# Patient Record
Sex: Female | Born: 1960 | Hispanic: Yes | Marital: Married | Smoking: Never smoker
Health system: Southern US, Community
[De-identification: ages and names within clinical notes are randomized; demographics above are authoritative.]

---

## 2017-10-23 ENCOUNTER — Encounter (HOSPITAL_COMMUNITY): Payer: Self-pay | Admitting: Emergency Medicine

## 2017-10-23 ENCOUNTER — Ambulatory Visit (HOSPITAL_COMMUNITY)
Admission: EM | Admit: 2017-10-23 | Discharge: 2017-10-23 | Disposition: A | Discharge status: Home / Self Care | Payer: Self-pay | Attending: Family Medicine | Admitting: Family Medicine

## 2017-10-23 DIAGNOSIS — S39012A Strain of muscle, fascia and tendon of lower back, initial encounter: Secondary | ICD-10-CM

## 2017-10-23 DIAGNOSIS — T148XXA Other injury of unspecified body region, initial encounter: Secondary | ICD-10-CM

## 2017-10-23 DIAGNOSIS — M545 Low back pain: Secondary | ICD-10-CM

## 2017-10-23 MED ORDER — NAPROXEN 500 MG PO TABS: 500.0000 mg | ORAL_TABLET | Freq: Two times a day (BID) | ORAL | 0 refills | Status: AC

## 2017-10-23 NOTE — Discharge Instructions (Signed)
Your exam was most consistent with muscle strain. Start Naproxen as directed. Ice/heat compresses as needed. This can take up to 3-4 weeks to completely resolve, but you should be feeling better each week. Follow up here or with PCP if symptoms worsen, changes for reevaluation. If experience numbness/tingling of the inner thighs, loss of bladder or bowel control, go to the emergency department for evaluation.

## 2017-10-23 NOTE — ED Provider Notes (Signed)
[facility]    CSN: [id number] Arrival date & time: 10/23/17  1224     History   Chief Complaint Chief Complaint  Patient presents with  . Back Pain    HPI Cynthia Lamb is a 56 y.o. female.   56 year old female comes in for 2 day history of back pain. States she woke up with the pain, which is constant, diffuse across the whole back. Describes the pain as spasms, worse with movement. Has not tried anything for pain, but has not noticed any alleviating factor. Denies injury/trauma. Denies urinary symptoms such as frequency, dysuria, hematuria. Denies fever, chills, night sweats.       History reviewed. No pertinent past medical history.  There are no active problems to display for this patient.   History reviewed. No pertinent surgical history.  OB History    No data available       Home Medications    Prior to Admission medications   Medication Sig Start Date End Date Taking? Authorizing Provider  naproxen (NAPROSYN) 500 MG tablet Take 1 tablet (500 mg total) 2 (two) times daily for 10 days by mouth. 10/23/17 11/02/17  Belinda FisherYu, Cynthia Resende V, PA-C    Family History History reviewed. No pertinent family history.  Social History Social History   Tobacco Use  . Smoking status: Never Smoker  . Smokeless tobacco: Never Used  Substance Use Topics  . Alcohol use: Not on file  . Drug use: Not on file     Allergies   Patient has no known allergies.   Review of Systems Review of Systems  Reason unable to perform ROS: See HPI as above.     Physical Exam Triage Vital Signs ED Triage Vitals  Enc Vitals Group     BP 10/23/17 1249 115/78     Pulse Rate 10/23/17 1249 70     Resp 10/23/17 1249 20     Temp 10/23/17 1249 98.9 F (37.2 C)     Temp Source 10/23/17 1249 Oral     SpO2 10/23/17 1249 99 %     Weight --      Height --      Head Circumference --      Peak Flow --      Pain Score 10/23/17 1250 8     Pain Loc --      Pain Edu? --    Excl. in GC? --    No data found.  Updated Vital Signs BP 115/78 (BP Location: Left Arm)   Pulse 70   Temp 98.9 F (37.2 C) (Oral)   Resp 20   SpO2 99%   Physical Exam  Constitutional: She is oriented to person, place, and time. She appears well-developed and well-nourished. No distress.  HENT:  Head: Normocephalic and atraumatic.  Eyes: Conjunctivae are normal. Pupils are equal, round, and reactive to light.  Neck: Normal range of motion. Neck supple. No spinous process tenderness and no muscular tenderness present. Normal range of motion present.  Cardiovascular: Normal rate, regular rhythm and normal heart sounds. Exam reveals no gallop and no friction rub.  No murmur heard. Pulmonary/Chest: Effort normal and breath sounds normal. She has no wheezes. She has no rales.  Musculoskeletal:  Patient states diffuse tenderness on palpation of whole back. No flinching/movement on exam. Full range of motion back/hips. Strength normal and equal bilaterally. Sensation intact and equal bilaterally. Negative straight leg raise.   Neurological: She is alert and oriented to person, place, and  time.  Skin: Skin is warm and dry.     UC Treatments / Results  Labs (all labs ordered are listed, but only abnormal results are displayed) Labs Reviewed - No data to display  EKG  EKG Interpretation None       Radiology No results found.  Procedures Procedures (including critical care time)  Medications Ordered in UC Medications - No data to display   Initial Impression / Assessment and Plan / UC Course  I have reviewed the triage vital signs and the nursing notes.  Pertinent labs & imaging results that were available during my care of the patient were reviewed by me and considered in my medical decision making (see chart for details).    Discussed with patient history and exam most consistent with muscle strain. Start NSAID as directed for pain and inflammation. Ice/heat compresses.  Discussed with patient strain can take up to 3-4 weeks to resolve, but should be getting better each week. Return precautions given.   Final Clinical Impressions(s) / UC Diagnoses   Final diagnoses:  Muscle strain    ED Discharge Orders        Ordered    naproxen (NAPROSYN) 500 MG tablet  2 times daily     10/23/17 1328       Belinda FisherYu, Cynthia Lamb, [place]PA-C 10/23/17 1332

## 2017-10-23 NOTE — ED Triage Notes (Signed)
Pt here for constant back pain onset 2 days  Reports pain increases w/activity  Denies inj/trauma, urinary sx, fevers, chills  A&O x4... NAD... Ambulatory

## 2018-02-12 ENCOUNTER — Encounter (HOSPITAL_COMMUNITY): Payer: Self-pay | Admitting: Emergency Medicine

## 2018-02-12 ENCOUNTER — Emergency Department (HOSPITAL_COMMUNITY): Payer: Self-pay

## 2018-02-12 ENCOUNTER — Emergency Department (HOSPITAL_COMMUNITY)
Admission: EM | Admit: 2018-02-12 | Discharge: 2018-02-12 | Disposition: A | Discharge status: Home / Self Care | Payer: Self-pay | Attending: Emergency Medicine | Admitting: Emergency Medicine

## 2018-02-12 DIAGNOSIS — J321 Chronic frontal sinusitis: Secondary | ICD-10-CM

## 2018-02-12 DIAGNOSIS — J0110 Acute frontal sinusitis, unspecified: Secondary | ICD-10-CM | POA: Insufficient documentation

## 2018-02-12 MED ORDER — BENZONATATE 100 MG PO CAPS: 100.0000 mg | ORAL_CAPSULE | Freq: Three times a day (TID) | ORAL | 0 refills | Status: DC | PRN

## 2018-02-12 MED ORDER — FLUTICASONE PROPIONATE 50 MCG/ACT NA SUSP: 1.0000 | Freq: Every day | NASAL | 2 refills | Status: DC

## 2018-02-12 MED ORDER — AMOXICILLIN-POT CLAVULANATE 875-125 MG PO TABS: 1.0000 | ORAL_TABLET | Freq: Two times a day (BID) | ORAL | 0 refills | Status: DC

## 2018-02-12 MED ORDER — IBUPROFEN 800 MG PO TABS: 800.0000 mg | ORAL_TABLET | Freq: Three times a day (TID) | ORAL | 0 refills | Status: DC

## 2018-02-12 NOTE — ED Triage Notes (Signed)
Per interpreter, patient c/o cough, congestion, intermittent fever and headache x1 month. Denies N/V/D and abdominal pain. Speaking in full sentences without difficulty.

## 2018-02-12 NOTE — Discharge Instructions (Signed)
You were seen in the emergency department today and diagnosed with sinusitis.  Your x-ray did not show signs of pneumonia.  We are treating you with multiple medications:  -Augmentin-this is an antibiotic, take this once in the morning and once in the evening for the next 7 days to treat the infection.Please take all of your antibiotics until finished. You may develop abdominal discomfort or diarrhea from the antibiotic.  You may help offset this with probiotics which you can buy at the store (ask your pharmacist if unable to find) or get probiotics in the form of eating yogurt. Do not eat or take the probiotics until 2 hours after your antibiotic. If you are unable to tolerate these side effects follow-up with your primary care provider or return to the emergency department.   If you begin to experience any blistering, rashes, swelling, or difficulty breathing seek medical care for evaluation of potentially more serious side effects.   Please be aware that this medication may interact with other medications you are taking, please be sure to discuss your medication list with your pharmacist.   -Ibuprofen 800 mg-take this every 8 hours as needed for pain.  -Flonase-use this once daily in each nostril to help with congestion.  Tessalon-take this once every 8 hours as needed to help with cough.  Follow-up with the [facility] or your primary care provider in 1 week for reevaluation.  Return to the emergency department for new or worsening symptoms including but not limited to chest pain, trouble breathing, change in your vision, numbness, weakness, inability to move your neck, or any other concerns you may have.  Additionally your blood pressure was elevated in the emergency department today-have this rechecked at your follow-up appointment. Vitals:   02/12/18 1540  BP: (!) 139/102  Pulse: 70  Resp: 18  Temp: 99.1 F (37.3 C)  SpO2: 96%     Google translate English to  Spanish: Usted fue atendido hoy en el servicio de urgencias y se le diagnostic sinusitis. Su radiografa no mostr signos de neumona. Te estamos tratando con mltiples medicamentos:  -Augmentina: este es un antibitico, tmelo una vez en la maana y Physiological scientist vez en la noche durante los prximos 7 das para tratar la infeccin. [employer] antibiticos hasta que termine. Usted Psychologist, clinical abdominal o diarrea por el antibitico. Puede ayudar a compensar esto con los probiticos que puede comprar en la tienda (pregntele a su farmacutico si no puede encontrarlos) u obtenga probiticos en la forma de Occupational hygienist. No coma ni tome los probiticos hasta 2 horas despus de su antibitico. Si no puede tolerar estos efectos secundarios, haga un seguimiento con su proveedor de atencin primaria o regrese al departamento de Sports administrator.  Si comienza a experimentar ampollas, erupciones, hinchazn o dificultad para respirar, busque atencin mdica para evaluar los efectos secundarios potencialmente ms graves.  Tenga en cuenta que este medicamento puede interactuar con otros medicamentos que est tomando, asegrese de hablar sobre su lista de medicamentos con su farmacutico.  -Ibuprofeno 800 mg: tmelo cada 8 horas segn sea necesario para Chief Technology Officer.  -Flonase: selo una vez al da en cada fosa nasal para ayudar con la congestin.  Tessalon: tome esto una vez cada 8 horas segn sea necesario para ayudar con la tos.  Haga un seguimiento con la clnica comunitaria de salud [facility] o con su proveedor de atencin primaria en 1 semana para su reevaluacin. Regrese al departamento de emergencias para los sntomas nuevos  o que empeoran, incluidos, entre otros, dolor de [place], dificultad para Industrial/product designerrespirar, [employer] Communicationscambios en su visin, entumecimiento, debilidad, incapacidad para mover el cuello o cualquier otra inquietud que pueda   Adems, su presin arterial se elev en el departamento de emergencias hoy; haga que  esto se verifique nuevamente en su cita de seguimiento.

## 2018-02-12 NOTE — ED Provider Notes (Signed)
[facility]-EMERGENCY DEPT Provider Note   CSN: [id number] Arrival date & time: 02/12/18  1513     History   Chief Complaint Chief Complaint  Patient presents with  . Cough    HPI Cynthia Lamb is a 57 y.o. female doubt significant past medical history who presents the emergency department today complaining of URI symptoms for the past 1 month.  Patient states she is having sinus pressure/pain, headaches, congestion, rhinorrhea, left ear pain, sore throat, and productive cough.  States she has some chest and back discomfort only when coughing, otherwise none.  Has tried over-the-counter cough medicines without relief.  Additionally experiencing subjective fevers and chills.  States she has had a history of similar headaches, not maximal in onset, gradual progression.  Denies dyspnea, change in vision, numbness, weakness, neck stiffness, or N/V/D.  Interpreter used throughout encounter.  HPI  History reviewed. No pertinent past medical history.  There are no active problems to display for this patient.   History reviewed. No pertinent surgical history.  OB History    No data available       Home Medications    Prior to Admission medications   Not on File    Family History History reviewed. No pertinent family history.  Social History Social History   Tobacco Use  . Smoking status: Never Smoker  . Smokeless tobacco: Never Used  Substance Use Topics  . Alcohol use: Not on file  . Drug use: Not on file     Allergies   Patient has no known allergies.   Review of Systems Review of Systems  Constitutional: Positive for chills and fever (subjective).  HENT: Positive for congestion, ear pain (L), rhinorrhea, sinus pressure, sinus pain and sore throat. Negative for drooling.   Eyes: Negative for visual disturbance.  Respiratory: Positive for cough. Negative for shortness of breath.   Cardiovascular: Positive for chest pain (only with  coughing otherwise none).  Gastrointestinal: Negative for abdominal pain, diarrhea, nausea and vomiting.  Musculoskeletal: Positive for back pain (only with couging, otherwise none). Negative for neck stiffness.  Neurological: Positive for headaches. Negative for dizziness, weakness and numbness.   Physical Exam Updated Vital Signs BP (!) 139/102 (BP Location: Left Arm)   Pulse 70   Temp 99.1 F (37.3 C) (Oral)   Resp 18   SpO2 96%   Physical Exam  Constitutional: She appears well-developed and well-nourished.  Non-toxic appearance. No distress.  HENT:  Head: Normocephalic and atraumatic.  Right Ear: Tympanic membrane is not perforated, not erythematous, not retracted and not bulging.  Left Ear: Tympanic membrane is not perforated, not erythematous, not retracted and not bulging.  Nose: Mucosal edema (and congestion) present. Right sinus exhibits frontal sinus tenderness. Right sinus exhibits no maxillary sinus tenderness. Left sinus exhibits frontal sinus tenderness. Left sinus exhibits no maxillary sinus tenderness.  Mouth/Throat: Uvula is midline and oropharynx is clear and moist. No oropharyngeal exudate or posterior oropharyngeal erythema.  Eyes: Conjunctivae and EOM are normal. Pupils are equal, round, and reactive to light. Right eye exhibits no discharge. Left eye exhibits no discharge.  Neck: Normal range of motion. Neck supple.  Cardiovascular: Normal rate and regular rhythm.  No murmur heard. Pulmonary/Chest: Effort normal and breath sounds normal. No respiratory distress. She has no wheezes. She has no rhonchi. She has no rales.  Abdominal: Soft. She exhibits no distension. There is no tenderness.  Lymphadenopathy:    She has no cervical adenopathy.  Neurological:  Alert. Clear speech.  No facial droop. CNIII-XII are intact. Bilateral upper and lower extremities' sensation grossly intact. 5/5 grip strength bilaterally. 5/5 plantar and dorsi flexion bilaterally. Gait is  normal.  Skin: Skin is warm and dry. No rash noted.  Psychiatric: She has a normal mood and affect. Her behavior is normal.  Nursing note and vitals reviewed.  ED Treatments / Results  Labs (all labs ordered are listed, but only abnormal results are displayed) Labs Reviewed - No data to display  EKG  EKG Interpretation None       Radiology Dg Chest 2 View  Result Date: 02/12/2018 CLINICAL DATA:  Cough, congestion, intermittent fever and headache for 1 month EXAM: CHEST - 2 VIEW COMPARISON:  None FINDINGS: Enlargement of cardiac silhouette. Mediastinal contours and pulmonary vascularity normal. Lungs clear. No pleural effusion or pneumothorax. Bones unremarkable. IMPRESSION: No acute abnormalities. Electronically Signed   By: Ulyses Southward M.D.   On: 02/12/2018 16:30    Procedures Procedures (including critical care time)  Medications Ordered in ED Medications - No data to display   Initial Impression / Assessment and Plan / ED Course  I have reviewed the triage vital signs and the nursing notes.  Pertinent labs & imaging results that were available during my care of the patient were reviewed by me and considered in my medical decision making (see chart for details).    Patient presents complaining of URI symptoms.  Patient is nontoxic-appearing, no apparent distress, vitals WNL other than somewhat elevated blood pressure-no indication of HTN emergency, patient aware of need for recheck.  Lungs CTA, chest x-ray negative for infiltrate, doubt pneumonia.  Center score 0, doubt strep pharyngitis.  Patient's headaches-- non concerning for [facility], ICH, ischemic CVA, acute glaucoma, giant cell arteritis, mass, or meningitis.  Patient with persistent symptoms for 1 month, reported fevers, and frontal sinus tenderness to palpation- will treat for sinusitis with Augmentin.  Will additionally provide supportive care with Flonase, ibuprofen, and Tessalon. I discussed results, treatment plan, need  for PCP follow-up, and return precautions with the patient. Provided opportunity for questions, patient confirmed understanding and is in agreement with plan.  Final Clinical Impressions(s) / ED Diagnoses   Final diagnoses:  Frontal sinusitis, unspecified chronicity    ED Discharge Orders        Ordered    amoxicillin-clavulanate (AUGMENTIN) 875-125 MG tablet  2 times daily     02/12/18 1802    fluticasone (FLONASE) 50 MCG/ACT nasal spray  Daily     02/12/18 1802    benzonatate (TESSALON) 100 MG capsule  3 times daily PRN     02/12/18 1802    ibuprofen (ADVIL,MOTRIN) 800 MG tablet  3 times daily     02/12/18 [address] PA-C 02/12/18 1850    Wynetta Fines, MD 02/13/18 1104

## 2018-07-15 ENCOUNTER — Encounter (HOSPITAL_COMMUNITY): Payer: Self-pay

## 2018-07-15 ENCOUNTER — Other Ambulatory Visit: Payer: Self-pay

## 2018-07-15 DIAGNOSIS — Z79899 Other long term (current) drug therapy: Secondary | ICD-10-CM | POA: Insufficient documentation

## 2018-07-15 DIAGNOSIS — R2242 Localized swelling, mass and lump, left lower limb: Secondary | ICD-10-CM | POA: Insufficient documentation

## 2018-07-15 DIAGNOSIS — M79662 Pain in left lower leg: Secondary | ICD-10-CM | POA: Insufficient documentation

## 2018-07-15 NOTE — ED Triage Notes (Signed)
Pt presents to ED from home for L leg pain and L foot swelling. Pt reports that she has pain in her L calf that started 4 days ago. Pt reports that she now has swelling in her L foot.

## 2018-07-16 ENCOUNTER — Emergency Department (HOSPITAL_COMMUNITY)
Admission: EM | Admit: 2018-07-16 | Discharge: 2018-07-16 | Disposition: A | Discharge status: Home / Self Care | Payer: Self-pay | Attending: Emergency Medicine | Admitting: Emergency Medicine

## 2018-07-16 ENCOUNTER — Ambulatory Visit (HOSPITAL_COMMUNITY)
Admission: RE | Admit: 2018-07-16 | Discharge: 2018-07-16 | Disposition: A | Discharge status: Home / Self Care | Payer: Self-pay | Source: Ambulatory Visit | Attending: Emergency Medicine | Admitting: Emergency Medicine

## 2018-07-16 DIAGNOSIS — M79609 Pain in unspecified limb: Secondary | ICD-10-CM

## 2018-07-16 DIAGNOSIS — M79662 Pain in left lower leg: Secondary | ICD-10-CM

## 2018-07-16 DIAGNOSIS — M7989 Other specified soft tissue disorders: Secondary | ICD-10-CM | POA: Insufficient documentation

## 2018-07-16 LAB — CBC WITH DIFFERENTIAL/PLATELET
BASOS ABS: 0 10*3/uL (ref 0.0–0.1)
BASOS PCT: 0 %
Eosinophils Absolute: 0.2 10*3/uL (ref 0.0–0.7)
Eosinophils Relative: 3 %
HEMATOCRIT: 36.9 % (ref 36.0–46.0)
HEMOGLOBIN: 12.2 g/dL (ref 12.0–15.0)
Lymphocytes Relative: 51 %
Lymphs Abs: 4.3 10*3/uL — ABNORMAL HIGH (ref 0.7–4.0)
MCH: 27.5 pg (ref 26.0–34.0)
MCHC: 33.1 g/dL (ref 30.0–36.0)
MCV: 83.3 fL (ref 78.0–100.0)
Monocytes Absolute: 0.5 10*3/uL (ref 0.1–1.0)
Monocytes Relative: 6 %
NEUTROS ABS: 3.4 10*3/uL (ref 1.7–7.7)
NEUTROS PCT: 40 %
Platelets: 260 10*3/uL (ref 150–400)
RBC: 4.43 MIL/uL (ref 3.87–5.11)
RDW: 14 % (ref 11.5–15.5)
WBC: 8.5 10*3/uL (ref 4.0–10.5)

## 2018-07-16 LAB — BASIC METABOLIC PANEL
ANION GAP: 7 (ref 5–15)
BUN: 17 mg/dL (ref 6–20)
CHLORIDE: 109 mmol/L (ref 98–111)
CO2: 27 mmol/L (ref 22–32)
Calcium: 8.9 mg/dL (ref 8.9–10.3)
Creatinine, Ser: 1.16 mg/dL — ABNORMAL HIGH (ref 0.44–1.00)
GFR calc non Af Amer: 52 mL/min — ABNORMAL LOW (ref >60)
GFR, EST AFRICAN AMERICAN: 60 mL/min — AB (ref >60)
Glucose, Bld: 96 mg/dL (ref 70–99)
Potassium: 4.2 mmol/L (ref 3.5–5.1)
SODIUM: 143 mmol/L (ref 135–145)

## 2018-07-16 MED ORDER — IBUPROFEN 800 MG PO TABS
800.0000 mg | ORAL_TABLET | Freq: Once | ORAL | Status: AC
  Administered 2018-07-16: 800 mg via ORAL
  Filled 2018-07-16: qty 1

## 2018-07-16 MED ORDER — ENOXAPARIN SODIUM 80 MG/0.8ML ~~LOC~~ SOLN
1.0000 mg/kg | Freq: Once | SUBCUTANEOUS | Status: AC
  Administered 2018-07-16: 07:00:00 65 mg via SUBCUTANEOUS
  Filled 2018-07-16: qty 0.65

## 2018-07-16 NOTE — Discharge Instructions (Signed)
Labs today looked good. Follow-up the instructions to have your ultrasound done in the morning. Return to the ED for new or worsening symptoms.

## 2018-07-16 NOTE — ED Provider Notes (Signed)
[facility]-EMER[facility]PT Provider Note   CSN: [id number] Arrival date & time: 07/15/18  2133     History   Chief Complaint Chief Complaint  Patient presents with  . Leg Pain  . Foot Swelling    HPI Cynthia Lamb is a 57 y.o. female.  The history is provided by the patient and medical records. The history is limited by a language barrier. A language interpreter was used.  Leg Pain      57 year old female presenting to the ED for left calf pain.  She reports calf pain started 4 days ago and has been steady since then.  Reports today she started noticing some swelling in her left ankle and foot.  She denies any injury, trauma, or falls.  Pain is worse with weightbearing and ambulation.  She denies any recent long distance travel, prolonged immobilization, surgery, or trauma.  She is not currently on birth control or any other exogenous estrogen.  She has no history of DVT or PE.  She denies any chest pain or shortness of breath.  No fever or chills.  Did try taking some Tylenol earlier today without relief.  History reviewed. No pertinent past medical history.  There are no active problems to display for this patient.   History reviewed. No pertinent surgical history.   OB History   None      Home Medications    Prior to Admission medications   Medication Sig Start Date End Date Taking? Authorizing Provider  amoxicillin-clavulanate (AUGMENTIN) 875-125 MG tablet Take 1 tablet by mouth 2 (two) times daily. 02/12/18   Petrucelli, Samantha R, PA-C  benzonatate (TESSALON) 100 MG capsule Take 1 capsule (100 mg total) by mouth 3 (three) times daily as needed for cough. 02/12/18   Petrucelli, Samantha R, PA-C  fluticasone (FLONASE) 50 MCG/ACT nasal spray Place 1 spray into both nostrils daily. 02/12/18   Petrucelli, Samantha R, PA-C  ibuprofen (ADVIL,MOTRIN) 800 MG tablet Take 1 tablet (800 mg total) by mouth 3 (three) times daily. 02/12/18   Petrucelli,  Pleas Koch, PA-C    Family History History reviewed. No pertinent family history.  Social History Social History   Tobacco Use  . Smoking status: Never Smoker  . Smokeless tobacco: Never Used  Substance Use Topics  . Alcohol use: Not on file  . Drug use: Not on file     Allergies   Patient has no known allergies.   Review of Systems Review of Systems  Musculoskeletal: Positive for arthralgias.  All other systems reviewed and are negative.    Physical Exam Updated Vital Signs BP (!) 151/98 (BP Location: Right Arm)   Pulse 65   Temp 98.1 F (36.7 C) (Oral)   Resp 18   Ht 5\' 4"  (1.626 m)   Wt 63.5 kg (140 lb)   SpO2 100%   BMI 24.03 kg/m   Physical Exam  Constitutional: She is oriented to person, place, and time. She appears well-developed and well-nourished.  HENT:  Head: Normocephalic and atraumatic.  Mouth/Throat: Oropharynx is clear and moist.  Eyes: Pupils are equal, round, and reactive to light. Conjunctivae and EOM are normal.  Neck: Normal range of motion.  Cardiovascular: Normal rate, regular rhythm and normal heart sounds.  Pulmonary/Chest: Effort normal and breath sounds normal.  Abdominal: Soft. Bowel sounds are normal.  Musculoskeletal: Normal range of motion.  Proximal left calf appears swollen compared with right, focal TTP in left posterior calf without palpable cord; no overlying skin changes,  DP pulse intact; normal distal sensation and perfusion, foot warm and dry  Neurological: She is alert and oriented to person, place, and time.  Skin: Skin is warm and dry.  Psychiatric: She has a normal mood and affect.  Nursing note and vitals reviewed.    ED Treatments / Results  Labs (all labs ordered are listed, but only abnormal results are displayed) Labs Reviewed  CBC WITH DIFFERENTIAL/PLATELET - Abnormal; Notable for the following components:      Result Value   Lymphs Abs 4.3 (*)    All other components within normal limits  BASIC  METABOLIC PANEL - Abnormal; Notable for the following components:   Creatinine, Ser 1.16 (*)    GFR calc non Af Amer 52 (*)    GFR calc Af Amer 60 (*)    All other components within normal limits    EKG None  Radiology No results found.  Procedures Procedures (including critical care time)  Medications Ordered in ED Medications  ibuprofen (ADVIL,MOTRIN) tablet 800 mg (800 mg Oral Given 07/16/18 0250)  enoxaparin (LOVENOX) injection 65 mg (65 mg Subcutaneous Given 07/16/18 [id number])     Initial Impression / Assessment and Plan / ED Course  I have reviewed the triage vital signs and the nursing notes.  Pertinent labs & imaging results that were available during my care of the patient were reviewed by me and considered in my medical decision making (see chart for details).  57 year old female presenting to the ED with left calf pain that began 4 days ago.  Reports swelling of the foot that began today.  Denies any prolonged immobilization, travel, exogenous estrogens, trauma, or falls.  On exam she has focal tenderness in the proximal left calf with some swelling compared with right.  Leg remains neurovascularly intact with normal distal sensation and perfusion.  She has no chest pain or SOB.  No tachycardia or hypoxia to suggest PE. Unfortunately, at this hour venous duplex study is unavailable to assess for DVT.  Screening labs were performed and are overall reassuring.  Patient given dose of Lovenox and will return in the morning for lower extremity duplex.   She was informed if study was positive she will be directed back to the ED for treatment, otherwise can follow-up with PCP.  Discussed plan with patient, she acknowledged understanding and agreed with plan of care.  Return precautions given for new or worsening symptoms.  Final Clinical Impressions(s) / ED Diagnoses   Final diagnoses:  Pain of left calf    ED Discharge Orders        Ordered         LE VENOUS     07/16/18 0347         Garlon HatchetSanders, Aland Chestnutt M, PA-C 07/16/18 [id number]    Geoffery Lyonselo, Douglas, MD 07/16/18 [phone]

## 2018-07-16 NOTE — Progress Notes (Signed)
LLE venous duplex prelim: negative for DVT. Heterogenous area noted in mid calf (area of pain) that is suggestive of a muscle tear. Farrel DemarkJill Eunice, RDMS, RVT

## 2019-03-07 ENCOUNTER — Encounter (HOSPITAL_COMMUNITY): Payer: Self-pay

## 2019-03-07 ENCOUNTER — Emergency Department (HOSPITAL_COMMUNITY): Payer: Self-pay

## 2019-03-07 ENCOUNTER — Emergency Department (HOSPITAL_COMMUNITY)
Admission: EM | Admit: 2019-03-07 | Discharge: 2019-03-07 | Disposition: A | Discharge status: Home / Self Care | Payer: Self-pay | Attending: Emergency Medicine | Admitting: Emergency Medicine

## 2019-03-07 ENCOUNTER — Other Ambulatory Visit: Payer: Self-pay

## 2019-03-07 DIAGNOSIS — R197 Diarrhea, unspecified: Secondary | ICD-10-CM | POA: Insufficient documentation

## 2019-03-07 DIAGNOSIS — R112 Nausea with vomiting, unspecified: Secondary | ICD-10-CM | POA: Insufficient documentation

## 2019-03-07 MED ORDER — ONDANSETRON 4 MG PO TBDP
4.0000 mg | ORAL_TABLET | Freq: Once | ORAL | Status: AC
  Administered 2019-03-07: 4 mg via ORAL
  Filled 2019-03-07: qty 1

## 2019-03-07 MED ORDER — ONDANSETRON 4 MG PO TBDP: 4.0000 mg | ORAL_TABLET | Freq: Three times a day (TID) | ORAL | 0 refills | Status: DC | PRN

## 2019-03-07 NOTE — ED Notes (Signed)
Pt given ginger ale.

## 2019-03-07 NOTE — Discharge Instructions (Signed)
Person Under Monitoring Name: Cynthia Lamb  Location: [address] [place] [address]   Infection Prevention Recommendations for Individuals Confirmed to have, or Being Evaluated for, 2019 Novel Coronavirus (COVID-19) Infection Who Receive Care at Home  Individuals who are confirmed to have, or are being evaluated for, COVID-19 should follow the prevention steps below until a healthcare provider or local or state health department says they can return to normal activities.  Stay home except to get medical care You should restrict activities outside your home, except for getting medical care. Do not go to work, school, or public areas, and do not use public transportation or taxis.  Call ahead before visiting your doctor Before your medical appointment, call the healthcare provider and tell them that you have, or are being evaluated for, COVID-19 infection. This will help the healthcare providers office take steps to keep other people from getting infected. Ask your healthcare provider to call the local or state health department.  Monitor your symptoms Seek prompt medical attention if your illness is worsening (e.g., difficulty breathing). Before going to your medical appointment, call the healthcare provider and tell them that you have, or are being evaluated for, COVID-19 infection. Ask your healthcare provider to call the local or state health department.  Wear a facemask You should wear a facemask that covers your nose and mouth when you are in the same room with other people and when you visit a healthcare provider. People who live with or visit you should also wear a facemask while they are in the same room with you.  Separate yourself from other people in your home As much as possible, you should stay in a different room from other people in your home. Also, you should use a separate bathroom, if available.  Avoid sharing household items You  should not share dishes, drinking glasses, cups, eating utensils, towels, bedding, or other items with other people in your home. After using these items, you should wash them thoroughly with soap and water.  Cover your coughs and sneezes Cover your mouth and nose with a tissue when you cough or sneeze, or you can cough or sneeze into your sleeve. Throw used tissues in a lined trash can, and immediately wash your hands with soap and water for at least 20 seconds or use an alcohol-based hand rub.  Wash your [employer] your hands often and thoroughly with soap and water for at least 20 seconds. You can use an alcohol-based hand sanitizer if soap and water are not available and if your hands are not visibly dirty. Avoid touching your eyes, nose, and mouth with unwashed hands.   Prevention Steps for Caregivers and Household Members of Individuals Confirmed to have, or Being Evaluated for, COVID-19 Infection Being Cared for in the Home  If you live with, or provide care at home for, a person confirmed to have, or being evaluated for, COVID-19 infection please follow these guidelines to prevent infection:  Follow healthcare providers instructions Make sure that you understand and can help the patient follow any healthcare provider instructions for all care.  Provide for the patients basic needs You should help the patient with basic needs in the home and provide support for getting groceries, prescriptions, and other personal needs.  Monitor the patients symptoms If they are getting sicker, call his or her medical provider and tell them that the patient has, or is being evaluated for, COVID-19 infection. This will help the healthcare providers  office take steps to keep other people from getting infected. Ask the healthcare provider to call the local or state health department.  Limit the number of people who have contact with the patient If possible, have only one caregiver for the  patient. Other household members should stay in another home or place of residence. If this is not possible, they should stay in another room, or be separated from the patient as much as possible. Use a separate bathroom, if available. Restrict visitors who do not have an essential need to be in the home.  Keep older adults, very young children, and other sick people away from the patient Keep older adults, very young children, and those who have compromised immune systems or chronic health conditions away from the patient. This includes people with chronic heart, lung, or kidney conditions, diabetes, and cancer.  Ensure good ventilation Make sure that shared spaces in the home have good air flow, such as from an air conditioner or an opened window, weather permitting.  Wash your hands often Wash your hands often and thoroughly with soap and water for at least 20 seconds. You can use an alcohol based hand sanitizer if soap and water are not available and if your hands are not visibly dirty. Avoid touching your eyes, nose, and mouth with unwashed hands. Use disposable paper towels to dry your hands. If not available, use dedicated cloth towels and replace them when they become wet.  Wear a facemask and gloves Wear a disposable facemask at all times in the room and gloves when you touch or have contact with the patients blood, body fluids, and/or secretions or excretions, such as sweat, saliva, sputum, nasal mucus, vomit, urine, or feces.  Ensure the mask fits over your nose and mouth tightly, and do not touch it during use. Throw out disposable facemasks and gloves after using them. Do not reuse. Wash your hands immediately after removing your facemask and gloves. If your personal clothing becomes contaminated, carefully remove clothing and launder. Wash your hands after handling contaminated clothing. Place all used disposable facemasks, gloves, and other waste in a lined container before  disposing them with other household waste. Remove gloves and wash your hands immediately after handling these items.  Do not share dishes, glasses, or other household items with the patient Avoid sharing household items. You should not share dishes, drinking glasses, cups, eating utensils, towels, bedding, or other items with a patient who is confirmed to have, or being evaluated for, COVID-19 infection. After the person uses these items, you should wash them thoroughly with soap and water.  Wash laundry thoroughly Immediately remove and wash clothes or bedding that have blood, body fluids, and/or secretions or excretions, such as sweat, saliva, sputum, nasal mucus, vomit, urine, or feces, on them. Wear gloves when handling laundry from the patient. Read and follow directions on labels of laundry or clothing items and detergent. In general, wash and dry with the warmest temperatures recommended on the label.  Clean all areas the individual has used often Clean all touchable surfaces, such as counters, tabletops, doorknobs, bathroom fixtures, toilets, phones, keyboards, tablets, and bedside tables, every day. Also, clean any surfaces that may have blood, body fluids, and/or secretions or excretions on them. Wear gloves when cleaning surfaces the patient has come in contact with. Use a diluted bleach solution (e.g., dilute bleach with 1 part bleach and 10 parts water) or a household disinfectant with a label that says EPA-registered for coronaviruses. To make a  bleach solution at home, add 1 tablespoon of bleach to 1 quart (4 cups) of water. For a larger supply, add  cup of bleach to 1 gallon (16 cups) of water. Read labels of cleaning products and follow recommendations provided on product labels. Labels contain instructions for safe and effective use of the cleaning product including precautions you should take when applying the product, such as wearing gloves or eye protection and making sure you  have good ventilation during use of the product. Remove gloves and wash hands immediately after cleaning.  Monitor yourself for signs and symptoms of illness Caregivers and household members are considered close contacts, should monitor their health, and will be asked to limit movement outside of the home to the extent possible. Follow the monitoring steps for close contacts listed on the symptom monitoring form.   ? If you have additional questions, contact your local health department or call the epidemiologist on call at [phone] (available 24/7). ? This guidance is subject to change. For the most up-to-date guidance from [facility], please refer to their website: [email or web address]

## 2019-03-07 NOTE — ED Triage Notes (Signed)
Pt c/o of fever since sat.  tues pt vomited and had diarrhea.  pt c/o of H/A.  Pt reports some coughing.   Pt returned from [place] Thursday last week.  Pt was in [place] 3 days.

## 2019-03-07 NOTE — ED Provider Notes (Signed)
[facility]-EMERGENCY DEPT Provider Note   CSN: [id number] Arrival date & time: 03/07/19  1225    History   Chief Complaint No chief complaint on file.   HPI Cynthia Lamb is a 58 y.o. female.    Patient was translated by granddaughter, who is also a patient. HPI Patient presents with fever.  Has had on and off for the last 5 days.  Reportedly had for 3 days in a day or 2 off and on started again today.  Had some vomiting and some diarrhea also.  Dull headache.  Has had some dull coughing.  Was in [place] last week for 3 days.  No definite known cold contacts.  No blood in the emesis or diarrhea.  Overall is been tolerating orals okay.has not taken any medicines.  No dysuria.  Marland KitchenHistory reviewed. No pertinent past medical history.  There are no active problems to display for this patient.   History reviewed. No pertinent surgical history.   OB History   No obstetric history on file.      Home Medications    Prior to Admission medications   Medication Sig Start Date End Date Taking? Authorizing Provider  amoxicillin-clavulanate (AUGMENTIN) 875-125 MG tablet Take 1 tablet by mouth 2 (two) times daily. Patient not taking: Reported on 07/16/2018 02/12/18   Petrucelli, Pleas Koch, PA-C  benzonatate (TESSALON) 100 MG capsule Take 1 capsule (100 mg total) by mouth 3 (three) times daily as needed for cough. Patient not taking: Reported on 07/16/2018 02/12/18   Petrucelli, Lelon Mast R, PA-C  fluticasone (FLONASE) 50 MCG/ACT nasal spray Place 1 spray into both nostrils daily. Patient not taking: Reported on 07/16/2018 02/12/18   Petrucelli, Pleas Koch, PA-C  ibuprofen (ADVIL,MOTRIN) 800 MG tablet Take 1 tablet (800 mg total) by mouth 3 (three) times daily. Patient not taking: Reported on 07/16/2018 02/12/18   Petrucelli, Lelon Mast R, PA-C  ondansetron (ZOFRAN-ODT) 4 MG disintegrating tablet Take 1 tablet (4 mg total) by mouth every 8 (eight) hours as needed for nausea or  vomiting. 03/07/19   Benjiman Core, MD    Family History No family history on file.  Social History Social History   Tobacco Use  . Smoking status: Never Smoker  . Smokeless tobacco: Never Used  Substance Use Topics  . Alcohol use: Not Currently  . Drug use: Not Currently     Allergies   Patient has no known allergies.   Review of Systems Review of Systems  Constitutional: Positive for fever.  HENT: Negative for congestion.   Respiratory: Positive for cough.   Cardiovascular: Negative for chest pain.  Gastrointestinal: Positive for nausea. Negative for abdominal pain.  Genitourinary: Negative for flank pain.  Musculoskeletal: Negative for back pain.  Skin: Negative for rash.  Neurological: Negative for weakness.  Psychiatric/Behavioral: Negative for confusion.     Physical Exam Updated Vital Signs BP (!) 132/103   Pulse 78   Temp 99.1 F (37.3 C)   Resp 16   Ht 5\' 3"  (1.6 m)   Wt 70.3 kg   SpO2 100%   BMI 27.46 kg/m   Physical Exam Vitals signs and nursing note reviewed.  HENT:     Head: Normocephalic.     Mouth/Throat:     Mouth: Mucous membranes are moist.  Cardiovascular:     Rate and Rhythm: Normal rate and regular rhythm.  Pulmonary:     Comments: Mildly harsh breath sounds at bases Abdominal:     Tenderness: There is no  abdominal tenderness.  Musculoskeletal:     Right lower leg: No edema.     Left lower leg: No edema.  Skin:    General: Skin is warm.     Capillary Refill: Capillary refill takes less than 2 seconds.  Neurological:     General: No focal deficit present.     Mental Status: She is alert.  Psychiatric:        Mood and Affect: Mood normal.      ED Treatments / Results  Labs (all labs ordered are listed, but only abnormal results are displayed) Labs Reviewed - No data to display  EKG None  Radiology Dg Chest Portable 1 View  Result Date: 03/07/2019 CLINICAL DATA:  Fever since 03/01/2019. EXAM: PORTABLE CHEST 1  VIEW COMPARISON:  None. FINDINGS: Lung volumes are low but the lungs are clear. Heart size is enlarged. No pneumothorax or pleural fluid. No acute or focal bony abnormality. IMPRESSION: Cardiomegaly without acute disease. Electronically Signed   By: Drusilla Kanner M.D.   On: 03/07/2019 13:33    Procedures Procedures (including critical care time)  Medications Ordered in ED Medications  ondansetron (ZOFRAN-ODT) disintegrating tablet 4 mg (4 mg Oral Given 03/07/19 1358)     Initial Impression / Assessment and Plan / ED Course  I have reviewed the triage vital signs and the nursing notes.  Pertinent labs & imaging results that were available during my care of the patient were reviewed by me and considered in my medical decision making (see chart for details).       Cynthia Lamb was evaluated in Emergency Department on 03/07/2019 for the symptoms described in the history of present illness. She was evaluated in the context of the global COVID-19 pandemic, which necessitated consideration that the patient might be at risk for infection with the SARS-CoV-2 virus that causes COVID-19. Institutional protocols and algorithms that pertain to the evaluation of patients at risk for COVID-19 are in a state of rapid change based on information released by regulatory bodies including the CDC and federal and state organizations. These policies and algorithms were followed during the patient's care in the ED.   Patient presents with nausea vomiting and fever.  Report is had a little bit of a cough.  However did come from [place].  X-ray reassuring.  Not hypoxic.  With the travel is at risk for covert infection.  I think will require isolation at this point.  Instructed on guidelines.  Will remain isolated for 72 hours after last fever and at least 7 hours after symptoms.    Final Clinical Impressions(s) / ED Diagnoses   Final diagnoses:  Nausea vomiting and diarrhea    ED Discharge Orders          Ordered    ondansetron (ZOFRAN-ODT) 4 MG disintegrating tablet  Every 8 hours PRN     03/07/19 1409           Benjiman Core, MD 03/07/19 1411

## 2019-03-28 IMAGING — CR DG CHEST 2V
2 series · 2 of 2 positions shown · non-contrast
Comparison: None

CLINICAL DATA: Cough, congestion, intermittent fever and headache
for 1 month

EXAM:
CHEST - 2 VIEW

[w chest pa]
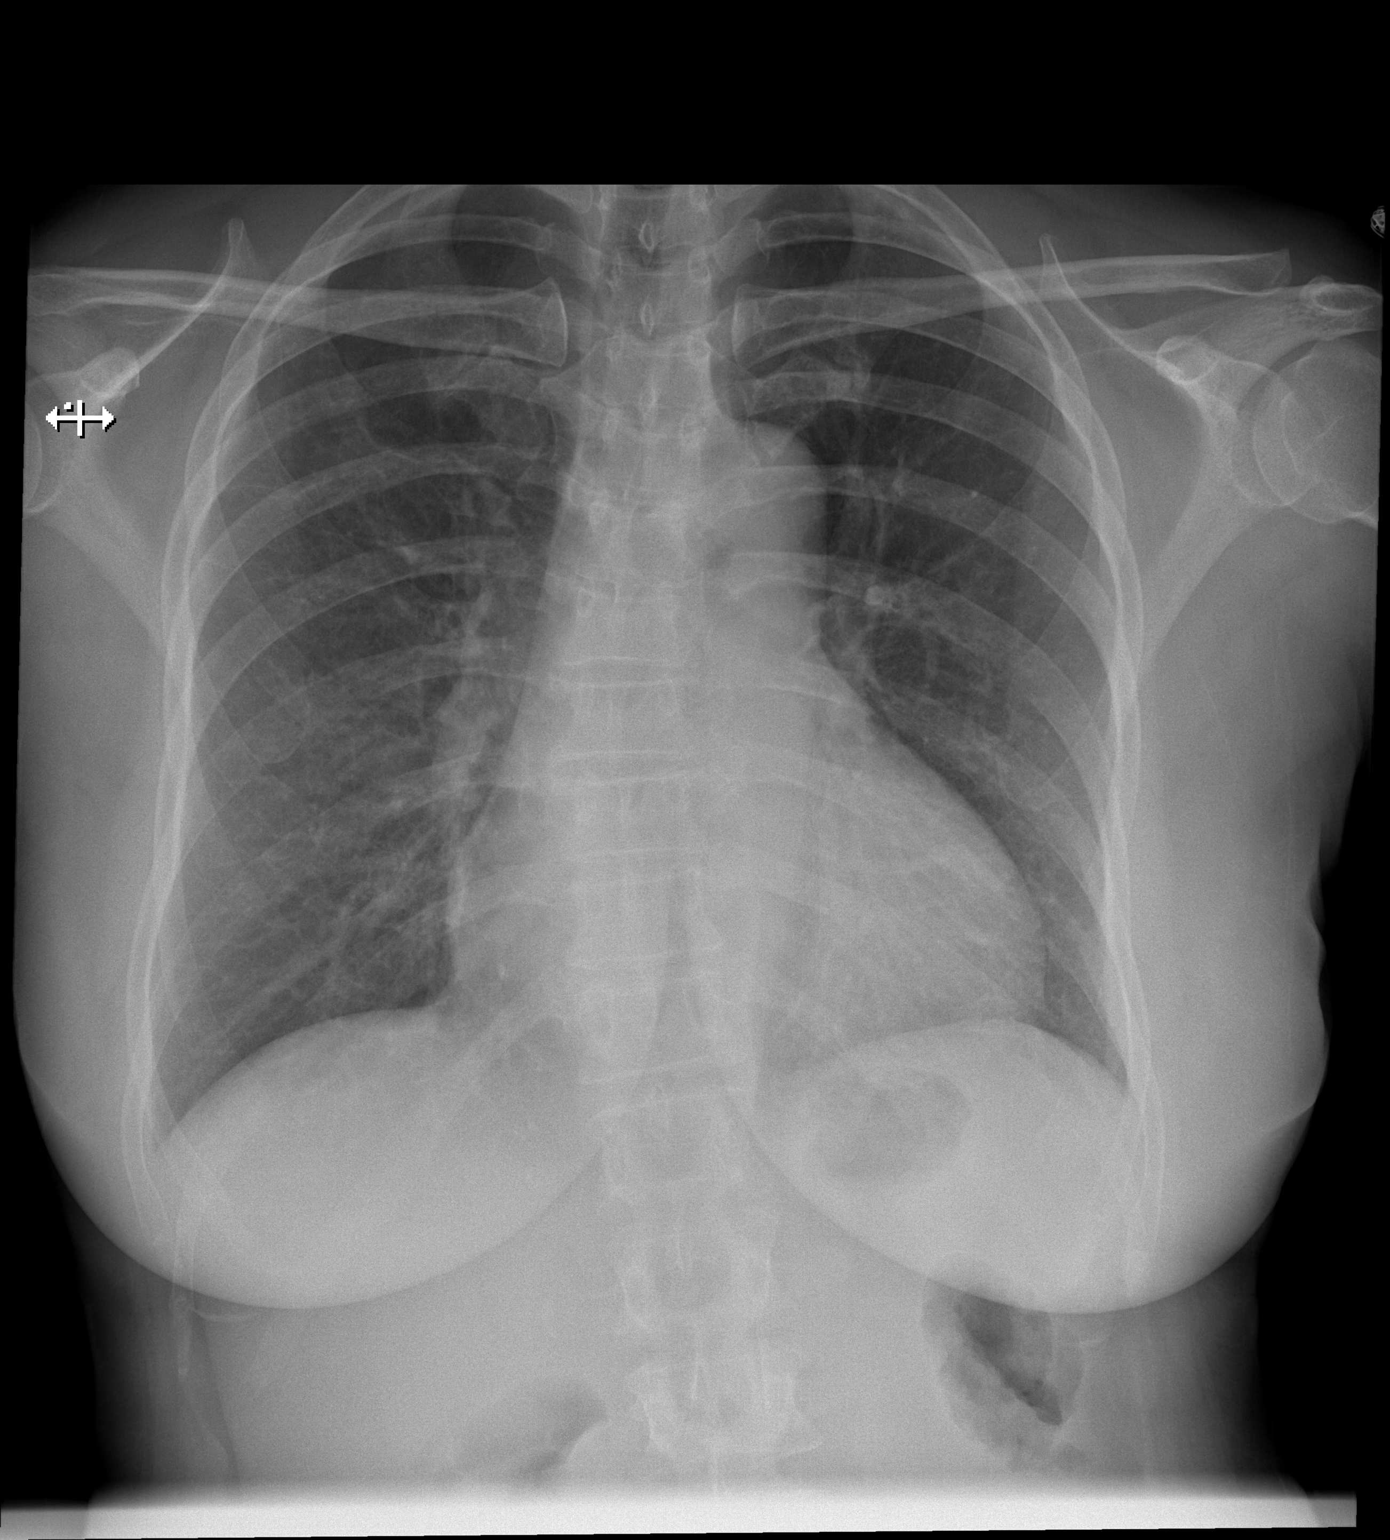

[w chest lat]
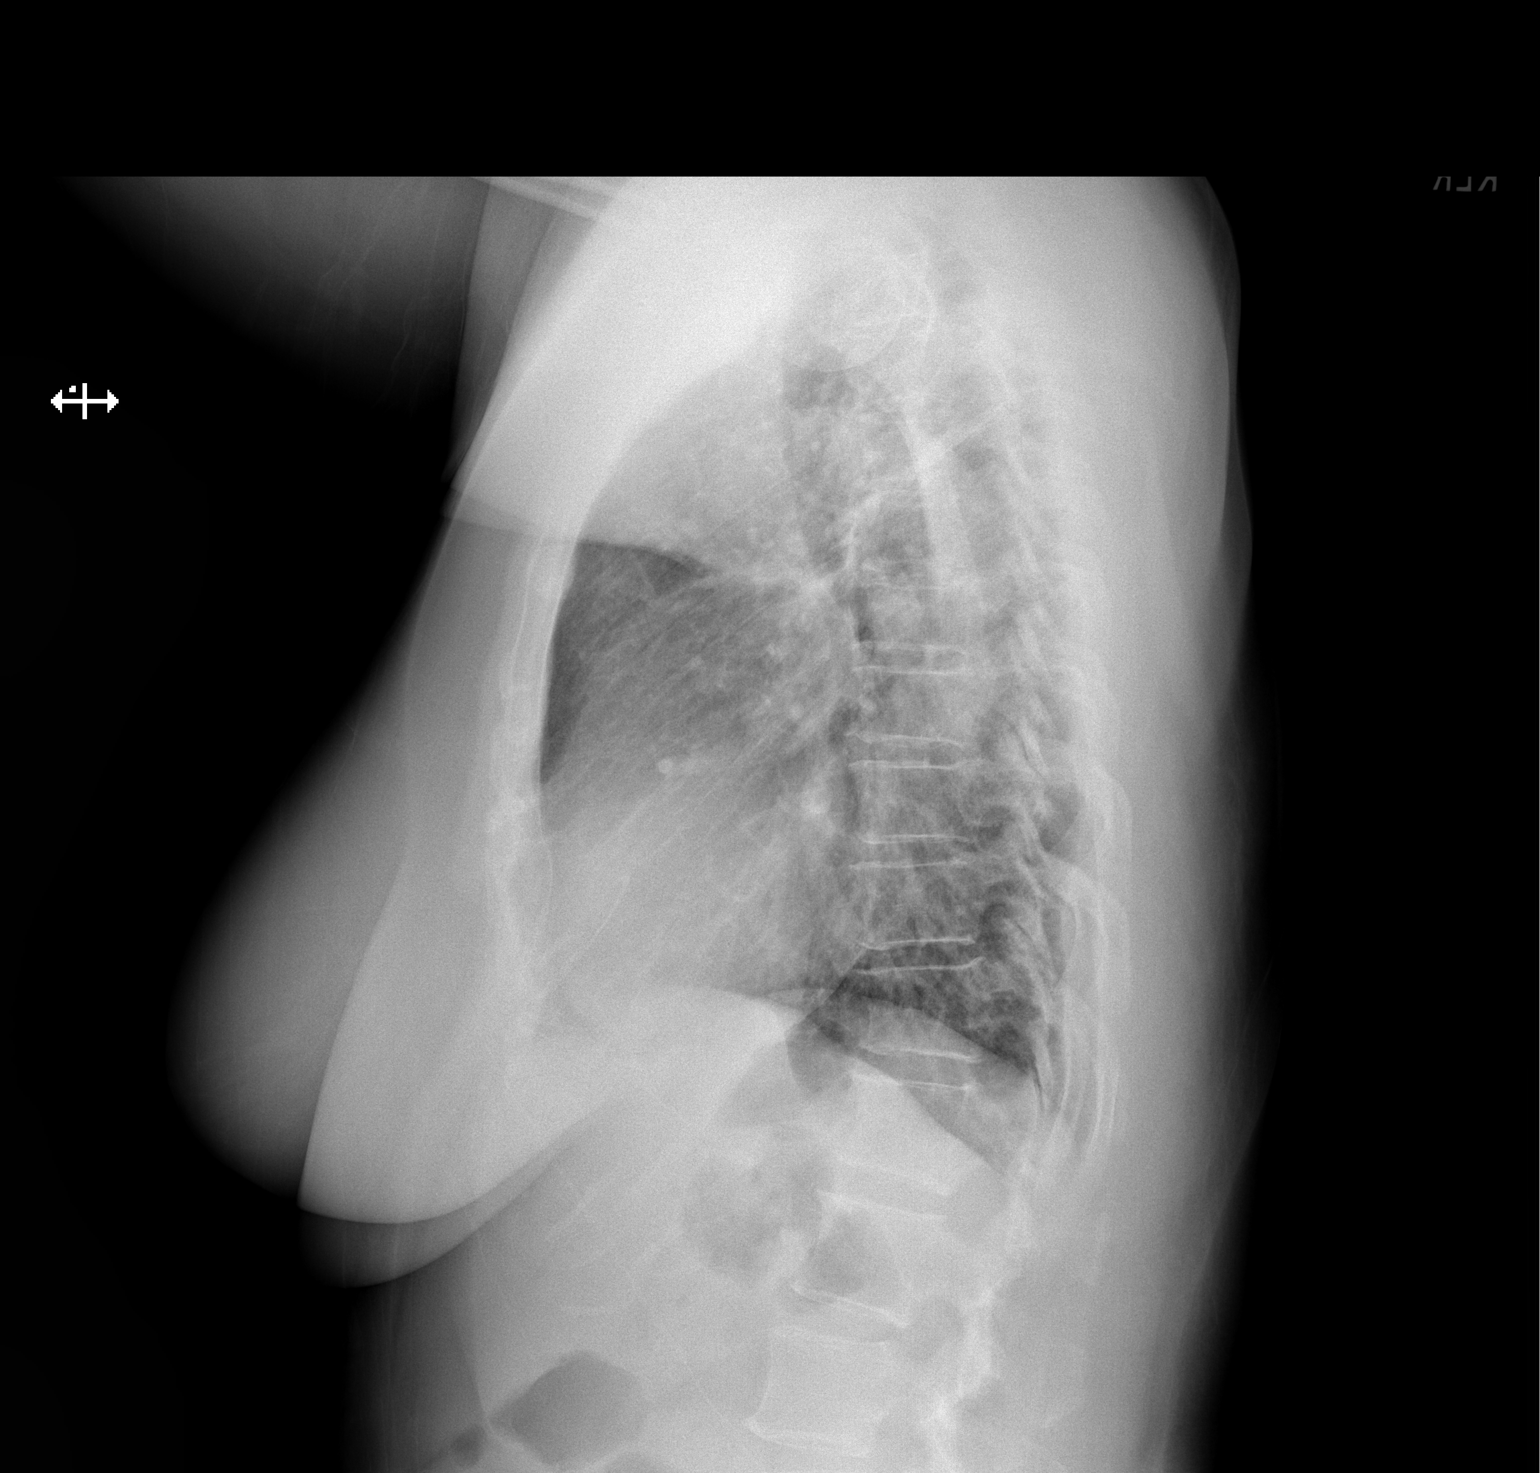

[2 of 2 positions shown; findings below may reference images not displayed]

FINDINGS: Enlargement of cardiac silhouette.

Mediastinal contours and pulmonary vascularity normal.

Lungs clear.

No pleural effusion or pneumothorax.

Bones unremarkable.
IMPRESSION: No acute abnormalities.

## 2020-04-19 IMAGING — DX PORTABLE CHEST - 1 VIEW
1 series · 1 of 1 positions shown · non-contrast
Comparison: None.

CLINICAL DATA: Fever since 03/01/2019.

EXAM:
PORTABLE CHEST 1 VIEW

[chest ap]
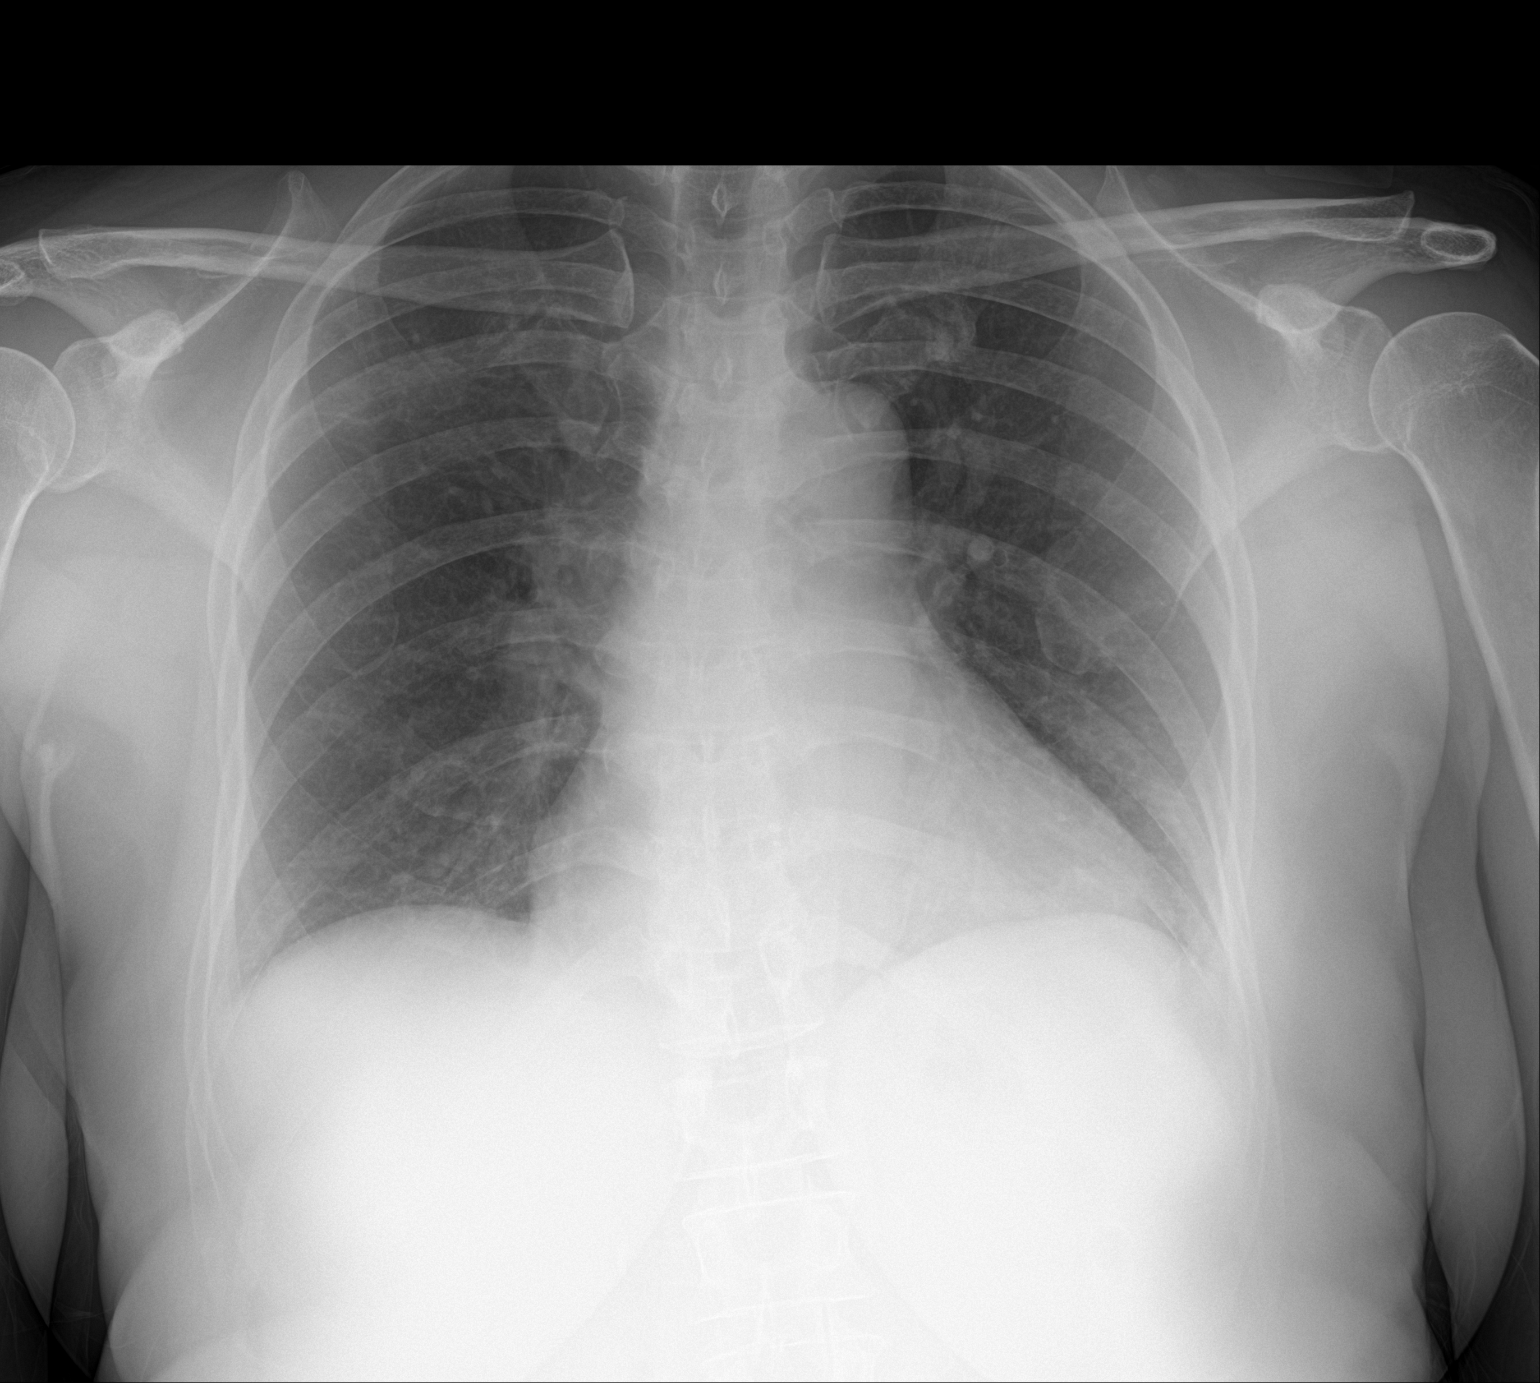

[1 of 1 positions shown; findings below may reference images not displayed]

FINDINGS: Lung volumes are low but the lungs are clear. Heart size is
enlarged. No pneumothorax or pleural fluid. No acute or focal bony
abnormality.
IMPRESSION: Cardiomegaly without acute disease.

## 2022-03-20 ENCOUNTER — Encounter (HOSPITAL_COMMUNITY): Payer: Self-pay | Admitting: *Deleted

## 2022-03-20 ENCOUNTER — Ambulatory Visit (HOSPITAL_COMMUNITY)
Admission: EM | Admit: 2022-03-20 | Discharge: 2022-03-20 | Disposition: A | Discharge status: Home / Self Care | Payer: Self-pay | Attending: Emergency Medicine | Admitting: Emergency Medicine

## 2022-03-20 ENCOUNTER — Other Ambulatory Visit: Payer: Self-pay

## 2022-03-20 DIAGNOSIS — M5431 Sciatica, right side: Secondary | ICD-10-CM

## 2022-03-20 MED ORDER — PREDNISONE 10 MG (21) PO TBPK: ORAL_TABLET | ORAL | 0 refills | Status: DC

## 2022-03-20 NOTE — ED Provider Notes (Signed)
?[facility] ? ? ? ?CSN: [id number] ?Arrival date & time: 03/20/22  1352 ? ? ?  ? ?History   ?Chief Complaint ?Chief Complaint  ?Patient presents with  ? Hip Pain  ? ? ?HPI ?Cynthia Lamb is a 61 y.o. female.  ? ?Patient presents with concerns of chronic low back and right hip pain. She is from the [place] and states she had gotten an injection and medications there but they have worn off. She inquires about getting medicine and therapy for her back. The patient does not remember when the pain started but reports chronic low back pain, more on the right, that radiates into the side of her right hip and down her leg with intermittent numbness. She states it flares up from time to time. The patient is not currently taking anything for the pain. She denies bowel/bladder incontinence or any new changes/symptoms from the chronic pain she has been having.  ? ?The history is provided by the patient. A language interpreter was used.  ?Hip Pain ? ? ?History reviewed. No pertinent past medical history. ? ?There are no problems to display for this patient. ? ? ?History reviewed. No pertinent surgical history. ? ?OB History   ?No obstetric history on file. ?  ? ? ? ?Home Medications   ? ?Prior to Admission medications   ?Medication Sig Start Date End Date Taking? Authorizing Provider  ?predniSONE (STERAPRED UNI-PAK 21 TAB) 10 MG (21) TBPK tablet Take as directed 03/20/22  Yes Max Romano L, PA  ? ? ?Family History ?History reviewed. No pertinent family history. ? ?Social History ?Social History  ? ?Tobacco Use  ? Smoking status: Never  ? Smokeless tobacco: Never  ? ? ? ?Allergies   ?Patient has no known allergies. ? ? ?Review of Systems ?Review of Systems  ?Constitutional:  Negative for fatigue and fever.  ?Cardiovascular:  Negative for leg swelling.  ?Genitourinary:  Negative for difficulty urinating.  ?Musculoskeletal:  Positive for arthralgias and back pain. Negative for joint swelling.  ?Skin:  Negative  for rash.  ?Neurological:  Positive for numbness. Negative for weakness.  ? ? ?Physical Exam ?Triage Vital Signs ?ED Triage Vitals  ?Enc Vitals Group  ?   BP 03/20/22 1442 131/89  ?   Pulse Rate 03/20/22 1442 (!) 52  ?   Resp 03/20/22 1442 18  ?   Temp 03/20/22 1442 98.6 ?F (37 ?C)  ?   Temp src --   ?   SpO2 03/20/22 1442 98 %  ?   Weight --   ?   Height --   ?   Head Circumference --   ?   Peak Flow --   ?   Pain Score 03/20/22 1438 10  ?   Pain Loc --   ?   Pain Edu? --   ?   Excl. in GC? --   ? ?No data found. ? ?Updated Vital Signs ?BP 131/89   Pulse (!) 52   Temp 98.6 ?F (37 ?C)   Resp 18   SpO2 98%  ? ?Visual Acuity ?Right Eye Distance:   ?Left Eye Distance:   ?Bilateral Distance:   ? ?Right Eye Near:   ?Left Eye Near:    ?Bilateral Near:    ? ?Physical Exam ?Vitals and nursing note reviewed.  ?Constitutional:   ?   General: She is not in acute distress. ?Eyes:  ?   Pupils: Pupils are equal, round, and reactive to light.  ?Cardiovascular:  ?  Rate and Rhythm: Normal rate and regular rhythm.  ?   Heart sounds: Normal heart sounds.  ?Pulmonary:  ?   Effort: Pulmonary effort is normal.  ?   Breath sounds: Normal breath sounds.  ?Musculoskeletal:  ?   Lumbar back: Tenderness present. No bony tenderness. Positive right straight leg raise test.  ?   Comments: Diffuse lower lumbar tenderness to right paraspinals into right posterior and lateral hip (no anterior tenderness).   ?Neurological:  ?   Mental Status: She is alert.  ?   Gait: Gait abnormal (antalgic favoring right).  ?Psychiatric:     ?   Mood and Affect: Mood normal.  ? ? ? ?UC Treatments / Results  ?Labs ?(all labs ordered are listed, but only abnormal results are displayed) ?Labs Reviewed - No data to display ? ?EKG ? ? ?Radiology ?No results found. ? ?Procedures ?Procedures (including critical care time) ? ?Medications Ordered in UC ?Medications - No data to display ? ?Initial Impression / Assessment and Plan / UC Course  ?I have reviewed the  triage vital signs and the nursing notes. ? ?Pertinent labs & imaging results that were available during my care of the patient were reviewed by me and considered in my medical decision making (see chart for details). ? ?  ? ?Chronic DDD with radicular symptoms. Short course prednisone for acute flare, provided information for ortho for ongoing management. No red flags.  ? ?E/M: 1 chronic illness with exacerbation, no data, moderate risk due to prescription management ? ?Final Clinical Impressions(s) / UC Diagnoses  ? ?Final diagnoses:  ?Sciatica of right side  ? ? ? ?Discharge Instructions   ? ?  ?Take medication as prescribed. Follow-up with spine specialist for further treatment.  ?[facility] ?[address] [place], [place] [address]  ?[phone] ? ? ?ED Prescriptions   ? ? Medication Sig Dispense Auth. Provider  ? predniSONE (STERAPRED UNI-PAK 21 TAB) 10 MG (21) TBPK tablet Take as directed 1 each Vallery Sa, Bettejane Leavens L, PA  ? ?  ? ?PDMP not reviewed this encounter. ?  Estanislado Pandy, [place] ?03/20/22 1533 ? ?

## 2022-03-20 NOTE — ED Triage Notes (Addendum)
T reports 2 weeks ago in her home country Pt was told she had 2 disc in her back that were causing the hip pain. Pt brought medical form with her. Pt reports receiving injection in hip when seen by DR in the [place]. ?

## 2022-03-20 NOTE — Discharge Instructions (Signed)
Take medication as prescribed. Follow-up with spine specialist for further treatment.  ?[facility] ?[address] [place], [place] [address]  ?[phone]

## 2022-03-22 ENCOUNTER — Ambulatory Visit (INDEPENDENT_AMBULATORY_CARE_PROVIDER_SITE_OTHER): Payer: Self-pay | Admitting: Orthopaedic Surgery

## 2022-03-22 DIAGNOSIS — G8929 Other chronic pain: Secondary | ICD-10-CM

## 2022-03-22 DIAGNOSIS — M545 Low back pain, unspecified: Secondary | ICD-10-CM

## 2022-03-22 NOTE — Progress Notes (Signed)
? ?                            ? ? ?Chief Complaint: Right lower back pain ?  ? ? ?History of Present Illness:  ? ? ?Cynthia Lamb is a 61 y.o. female presents with many years of ongoing right lower back pain. She states for the last year this has been radiating down the right leg. She previously has lived in [place] where she was getting treatment. She was told she had two herniated disks and was given an epidural steroid injection.  This gave her several months of relief but is worn out.  She is subsequently back in [place] full-time.  She is here today seeking further treatment.  She is not currently employed. ? ? ? ?Surgical History:   ?None ? ?PMH/PSH/Family History/Social History/Meds/Allergies:   ?No past medical history on file. ?No past surgical history on file. ?Social History  ? ?Socioeconomic History  ? Marital status: Married  ?  Spouse name: Not on file  ? Number of children: Not on file  ? Years of education: Not on file  ? Highest education level: Not on file  ?Occupational History  ? Not on file  ?Tobacco Use  ? Smoking status: Never  ? Smokeless tobacco: Never  ?Substance and Sexual Activity  ? Alcohol use: Not on file  ? Drug use: Not on file  ? Sexual activity: Not on file  ?Other Topics Concern  ? Not on file  ?Social History Narrative  ? Not on file  ? ?Social Determinants of Health  ? ?Financial Resource Strain: Not on file  ?Food Insecurity: Not on file  ?Transportation Needs: Not on file  ?Physical Activity: Not on file  ?Stress: Not on file  ?Social Connections: Not on file  ? ?No family history on file. ?No Known Allergies ?Current Outpatient Medications  ?Medication Sig Dispense Refill  ? predniSONE (STERAPRED UNI-PAK 21 TAB) 10 MG (21) TBPK tablet Take as directed 1 each 0  ? ?No current facility-administered medications for this visit.  ? ?No results found. ? ?Review of Systems:   ?A ROS was performed including pertinent positives and negatives as documented in the  HPI. ? ?Physical Exam :   ?Constitutional: NAD and appears stated age ?Neurological: Alert and oriented ?Psych: Appropriate affect and cooperative ?There were no vitals taken for this visit.  ? ?Comprehensive Musculoskeletal Exam:   ? ?Tenderness to palpation about the lumbar spine with radiation down the lateral aspect of the thigh.  She has full strength and sensation bilateral extremity.  Positive straight leg raise on the right ? ?Imaging:   ? ? ?I personally reviewed and interpreted the radiographs. ? ? ?Assessment:   ?61 y.o. female with a known history of lumbar disc herniations.  She presents today with current pain going down the side.  This has been going for over a year.  She has previously tried physical therapy and injections in the [place].  She continually has no pain relief.  As result of this time I do believe that work-up with an MRI that is updated in order to assess the status of these disc herniations would be imperative that it is likely that she would be a surgical candidate ? ?Plan :   ? ?-Plan for MRI and follow-up to discuss results ? ? ?I believe that advance imaging in the form of an MRI is indicated for the  following reasons: ?-Xrays images were obtained and not diagnostic ?-The patient has failed treatment modalities including rest, activity restriction, steroids, physical therapy ?-The following worrisome symptoms are present on history and exam: Pain radiating down the side of the leg ? ? ? ? ? ? ?I personally saw and evaluated the patient, and participated in the management and treatment plan. ? ?Huel Cote, MD ?Attending Physician, Orthopedic Surgery ? ?This document was dictated using Conservation officer, historic buildings. A reasonable attempt at proof reading has been made to minimize errors. ?

## 2022-04-21 ENCOUNTER — Ambulatory Visit (INDEPENDENT_AMBULATORY_CARE_PROVIDER_SITE_OTHER): Payer: 59 | Admitting: Orthopaedic Surgery

## 2022-04-21 DIAGNOSIS — M5126 Other intervertebral disc displacement, lumbar region: Secondary | ICD-10-CM

## 2022-04-21 DIAGNOSIS — M5459 Other low back pain: Secondary | ICD-10-CM

## 2022-04-21 NOTE — Progress Notes (Signed)
? ?                            ? ? ?Chief Complaint: Right lower back pain ?  ? ? ?History of Present Illness:  ? ?04/21/2022: Presents today for follow-up of her lower back pain.  She is here today with her MRI.  Her pain is about the same she has not had any improvements in this.  She continues to have pain that is going down the right side.  On further questioning it sounds like she has not had any specific injections into the back. ? ?Cynthia Lamb is a 61 y.o. female presents with many years of ongoing right lower back pain. She states for the last year this has been radiating down the right leg. She previously has lived in [place] where she was getting treatment. She was told she had two herniated disks and was given an epidural steroid injection.  This gave her several months of relief but is worn out.  She is subsequently back in [place] full-time.  She is here today seeking further treatment.  She is not currently employed. ? ? ? ?Surgical History:   ?None ? ?PMH/PSH/Family History/Social History/Meds/Allergies:   ?No past medical history on file. ?No past surgical history on file. ?Social History  ? ?Socioeconomic History  ? Marital status: Married  ?  Spouse name: Not on file  ? Number of children: Not on file  ? Years of education: Not on file  ? Highest education level: Not on file  ?Occupational History  ? Not on file  ?Tobacco Use  ? Smoking status: Never  ? Smokeless tobacco: Never  ?Substance and Sexual Activity  ? Alcohol use: Not on file  ? Drug use: Not on file  ? Sexual activity: Not on file  ?Other Topics Concern  ? Not on file  ?Social History Narrative  ? Not on file  ? ?Social Determinants of Health  ? ?Financial Resource Strain: Not on file  ?Food Insecurity: Not on file  ?Transportation Needs: Not on file  ?Physical Activity: Not on file  ?Stress: Not on file  ?Social Connections: Not on file  ? ?No family history on file. ?No Known Allergies ?Current Outpatient  Medications  ?Medication Sig Dispense Refill  ? predniSONE (STERAPRED UNI-PAK 21 TAB) 10 MG (21) TBPK tablet Take as directed 1 each 0  ? ?No current facility-administered medications for this visit.  ? ?No results found. ? ?Review of Systems:   ?A ROS was performed including pertinent positives and negatives as documented in the HPI. ? ?Physical Exam :   ?Constitutional: NAD and appears stated age ?Neurological: Alert and oriented ?Psych: Appropriate affect and cooperative ?There were no vitals taken for this visit.  ? ?Comprehensive Musculoskeletal Exam:   ? ?Tenderness to palpation about the lumbar spine with radiation down the lateral aspect of the thigh.  She has full strength and sensation bilateral extremity.  Positive straight leg raise on the right ? ?Imaging:   ?MRI lumbar spine: ?She has disc herniations at the L4-L5 level affecting left and right side ? ?I personally reviewed and interpreted the radiographs. ? ? ?Assessment:   ?61 y.o. female with a known history of lumbar disc herniations.  MRI is consistent with this.  At this time we will plan for referral to Dr. Ernestina Patches for injections at the L4-L5 level particularly on the right side if this is more  symptomatic.  She also participate in physical therapy for core strengthening ? ?Plan :   ? ?-Plan for referral to Dr. Ernestina Patches for L4-L5 injections ?-Plan for physical therapy for core strengthening and back ? ? ? ? ? ? ?I personally saw and evaluated the patient, and participated in the management and treatment plan. ? ?Vanetta Mulders, MD ?Attending Physician, Orthopedic Surgery ? ?This document was dictated using Systems analyst. A reasonable attempt at proof reading has been made to minimize errors. ?

## 2022-04-25 ENCOUNTER — Telehealth: Payer: Self-pay | Admitting: Physical Medicine and Rehabilitation

## 2022-04-25 NOTE — Telephone Encounter (Signed)
NA

## 2022-06-01 ENCOUNTER — Ambulatory Visit: Payer: Self-pay

## 2022-06-06 ENCOUNTER — Ambulatory Visit: Payer: 59 | Attending: Orthopaedic Surgery

## 2022-06-06 DIAGNOSIS — M5416 Radiculopathy, lumbar region: Secondary | ICD-10-CM | POA: Insufficient documentation

## 2022-06-06 DIAGNOSIS — M6281 Muscle weakness (generalized): Secondary | ICD-10-CM | POA: Diagnosis present

## 2022-06-06 DIAGNOSIS — R293 Abnormal posture: Secondary | ICD-10-CM | POA: Insufficient documentation

## 2022-06-06 DIAGNOSIS — R262 Difficulty in walking, not elsewhere classified: Secondary | ICD-10-CM | POA: Insufficient documentation

## 2022-06-06 DIAGNOSIS — M5126 Other intervertebral disc displacement, lumbar region: Secondary | ICD-10-CM | POA: Insufficient documentation

## 2022-06-06 DIAGNOSIS — R252 Cramp and spasm: Secondary | ICD-10-CM | POA: Diagnosis present

## 2022-06-08 ENCOUNTER — Ambulatory Visit: Payer: 59

## 2022-06-08 DIAGNOSIS — R252 Cramp and spasm: Secondary | ICD-10-CM

## 2022-06-08 DIAGNOSIS — M5416 Radiculopathy, lumbar region: Secondary | ICD-10-CM | POA: Diagnosis not present

## 2022-06-08 DIAGNOSIS — R262 Difficulty in walking, not elsewhere classified: Secondary | ICD-10-CM

## 2022-06-08 DIAGNOSIS — R293 Abnormal posture: Secondary | ICD-10-CM

## 2022-06-08 DIAGNOSIS — M6281 Muscle weakness (generalized): Secondary | ICD-10-CM

## 2022-06-08 NOTE — Therapy (Signed)
OUTPATIENT PHYSICAL THERAPY TREATMENT NOTE   Patient Name: Cynthia Lamb MRN: [id number] DOB:1961-08-08, 61 y.o., female Today's Date: 06/08/2022  PCP: does not have PCP REFERRING PROVIDER:   Huel Cote, MD    END OF SESSION:   PT End of Session - 06/08/22 1448     Visit Number 2    Date for PT Re-Evaluation 08/01/22    Authorization Type [employer]    PT Start Time 1448    PT Stop Time 1530    PT Time Calculation (min) 42 min    Activity Tolerance Patient tolerated treatment well    Behavior During Therapy [facility] for tasks assessed/performed             History reviewed. No pertinent past medical history. History reviewed. No pertinent surgical history. Patient Active Problem List   Diagnosis Date Noted   Radiculopathy, lumbar region 06/06/2022    REFERRING DIAG: M51.26 (ICD-10-CM) - Herniation of right side of L4-L5 intervertebral disc   THERAPY DIAG:  Radiculopathy, lumbar region  Abnormal posture  Cramp and spasm  Muscle weakness (generalized)  Difficulty in walking, not elsewhere classified  Rationale for Evaluation and Treatment Rehabilitation  PERTINENT HISTORY: see above  PRECAUTIONS: none  SUBJECTIVE: Patient arrives   PAIN:  Are you having pain? Yes: NPRS scale: 5/10 Pain location: low back Pain description: aching Aggravating factors: standing, doing housework Relieving factors: meds, rest   OBJECTIVE: (objective measures completed at initial evaluation unless otherwise dated)  OBJECTIVE:    DIAGNOSTIC FINDINGS:  Appears MRI was ordered in April   PATIENT SURVEYS:  FOTO 50 (goal is 32)   SCREENING FOR RED FLAGS: Bowel or bladder incontinence: No Spinal tumors: No Cauda equina syndrome: No Compression fracture: No Abdominal aneurysm: No   COGNITION:           Overall cognitive status: Within functional limits for tasks assessed                          SENSATION: Radicular symptoms present right LE along  L4-L5    MUSCLE LENGTH: Hamstrings: Right 60 deg; Left 65 deg Thomas test: Right pos ; Left pos    POSTURE: increased lumbar lordosis     LUMBAR ROM:    Active  A/PROM  eval  Flexion WNL  Extension WNL  Right lateral flexion To just above joint line  Left lateral flexion To just above joint line  Right rotation WNL  Left rotation WNL   (Blank rows = not tested)   LOWER EXTREMITY ROM:      WFL   LOWER EXTREMITY MMT:     Generally 4 to 4+/5   LUMBAR SPECIAL TESTS:  Straight leg raise test: Positive   GAIT: Distance walked: 50 Assistive device utilized: None Level of assistance: Complete Independence Comments: antalgic slow guarded       TODAY'S TREATMENT:  05/12/22 NuStep x 5 min level 3 PT present to discuss progress Reviewed HEP Added core exercises as follows:  PPT  PPT with alternating arm and leg PPT with SLR and physioball shoulder flexion x 20 each LE PPT with clamshell in hook lying x 20 Ice to low back x 10 min in hook lying  TODAY'S TREATMENT  Initial eval completed and initiated HEP     PATIENT EDUCATION:  Education details: Reviewed HEP, added core stabilization Person educated: Patient Education method: Explanation, Demonstration, Verbal cues, and Handouts Education comprehension: verbalized understanding, returned demonstration, and verbal  cues required     HOME EXERCISE PROGRAM: Access Code: [email or web address] URL: [email or web address] Date: 06/08/2022 Prepared by: Cynthia Lamb  Exercises - Supine Hamstring Stretch with Strap  - 1 x daily - 7 x weekly - 3 sets - 10 reps - Lying Prone  - 1 x daily - 7 x weekly - 1 sets - 1 reps - 2 min hold - Static Prone on Elbows  - 1 x daily - 7 x weekly - 1 sets - 1 reps - Prone Push Ups on Forearms  - 1 x daily - 7 x weekly - 1 sets - 10 reps - Supine Posterior Pelvic Tilt  - 1 x daily - 7 x weekly - 1 sets - 20 reps - Dead Bug  - 1 x daily - 7 x weekly - 1 sets - 20 reps - Supine Pelvic  Tilt with Straight Leg Raise  - 1 x daily - 7 x weekly - 2 sets - 10 reps - Hooklying Clamshell with Resistance  - 1 x daily - 7 x weekly - 1 sets - 20 reps   ASSESSMENT:   CLINICAL IMPRESSION: Cynthia Lamb is having decreased radicular symptoms.  She responded well to initial HEP.  She was able to tolerate addition of core stabilization exercises with only minimal fatigue.   However, she needed heavy verbal cues for correct technique on core exercises.  She would respond well to continued PT for LE stretching, core strengthening and pain control.       OBJECTIVE IMPAIRMENTS difficulty walking, decreased ROM, decreased strength, increased fascial restrictions, increased muscle spasms, impaired flexibility, impaired sensation, improper body mechanics, postural dysfunction, and pain.    ACTIVITY LIMITATIONS carrying, lifting, bending, standing, squatting, sleeping, and transfers   PARTICIPATION LIMITATIONS: meal prep, cleaning, laundry, driving, shopping, community activity, occupation, and yard work   PERSONAL FACTORS Education and Past/current experiences are also affecting patient's functional outcome.    REHAB POTENTIAL: Good   CLINICAL DECISION MAKING: Stable/uncomplicated   EVALUATION COMPLEXITY: Low     GOALS: Goals reviewed with patient? Yes   SHORT TERM GOALS: Target date: 07/04/2022   Pain report to be no greater than 4/10  Baseline: Goal status: INITIAL   2.  Patient will be independent with initial HEP  Baseline:  Goal status: INITIAL   LONG TERM GOALS: Target date: 08/01/2022   Patient to be independent with advanced HEP  Baseline:  Goal status: INITIAL   2.  Patient to report pain no greater than 2/10  Baseline:  Goal status: INITIAL   3.  Radicular symptoms to centralize Baseline:  Goal status: INITIAL       PLAN: PT FREQUENCY: 2x/week   PT DURATION: 8 weeks   PLANNED INTERVENTIONS: Therapeutic exercises, Therapeutic activity, Neuromuscular  re-education, Balance training, Gait training, Patient/Family education, Joint mobilization, Aquatic Therapy, Dry Needling, Electrical stimulation, Spinal mobilization, Cryotherapy, Moist heat, Taping, Traction, Ultrasound, Manual therapy, and Re-evaluation.   PLAN FOR NEXT SESSION: Review HEP, NuStep, assess effectiveness of McKenzie.       Cynthia Lamb, PT 06/08/22 5:06 PM  [facility] [address] [place], [place] [address] Phone # [phone] Fax [phone]

## 2022-06-14 ENCOUNTER — Ambulatory Visit: Payer: 59 | Attending: Orthopaedic Surgery | Admitting: Rehabilitative and Restorative Service Providers"

## 2022-06-14 ENCOUNTER — Telehealth: Payer: Self-pay | Admitting: Rehabilitative and Restorative Service Providers"

## 2022-06-14 DIAGNOSIS — M5416 Radiculopathy, lumbar region: Secondary | ICD-10-CM | POA: Insufficient documentation

## 2022-06-14 DIAGNOSIS — R252 Cramp and spasm: Secondary | ICD-10-CM | POA: Insufficient documentation

## 2022-06-14 DIAGNOSIS — R293 Abnormal posture: Secondary | ICD-10-CM | POA: Insufficient documentation

## 2022-06-14 DIAGNOSIS — M6281 Muscle weakness (generalized): Secondary | ICD-10-CM | POA: Insufficient documentation

## 2022-06-14 DIAGNOSIS — R262 Difficulty in walking, not elsewhere classified: Secondary | ICD-10-CM | POA: Insufficient documentation

## 2022-06-14 NOTE — Telephone Encounter (Signed)
Contacted pt via in person [facility] [employer] to notify of missed visit today at 15:45 hrs.  Pt reminded of cancellation policy and reminded of next scheduled PT appointment.  Pt verbalizes understanding.

## 2022-06-20 ENCOUNTER — Ambulatory Visit: Payer: 59

## 2022-06-20 DIAGNOSIS — R252 Cramp and spasm: Secondary | ICD-10-CM | POA: Diagnosis present

## 2022-06-20 DIAGNOSIS — M6281 Muscle weakness (generalized): Secondary | ICD-10-CM | POA: Diagnosis present

## 2022-06-20 DIAGNOSIS — M5416 Radiculopathy, lumbar region: Secondary | ICD-10-CM

## 2022-06-20 DIAGNOSIS — R293 Abnormal posture: Secondary | ICD-10-CM

## 2022-06-20 DIAGNOSIS — R262 Difficulty in walking, not elsewhere classified: Secondary | ICD-10-CM | POA: Diagnosis present

## 2022-06-20 NOTE — Therapy (Signed)
OUTPATIENT PHYSICAL THERAPY TREATMENT NOTE   Patient Name: Cynthia Lamb MRN: [id number] DOB:06/04/1961, 61 y.o., female Today's Date: 06/20/2022  PCP: does not have PCP REFERRING PROVIDER:   Vanetta Mulders, MD    END OF SESSION:   PT End of Session - 06/20/22 1013     Visit Number 3    Date for PT Re-Evaluation 08/01/22    Authorization Type [employer]    PT Start Time 1008    PT Stop Time 1051    PT Time Calculation (min) 43 min    Activity Tolerance Patient tolerated treatment well    Behavior During Therapy [facility] for tasks assessed/performed             History reviewed. No pertinent past medical history. History reviewed. No pertinent surgical history. Patient Active Problem List   Diagnosis Date Noted   Radiculopathy, lumbar region 06/06/2022    REFERRING DIAG: M51.26 (ICD-10-CM) - Herniation of right side of L4-L5 intervertebral disc   THERAPY DIAG:  Radiculopathy, lumbar region  Abnormal posture  Cramp and spasm  Muscle weakness (generalized)  Difficulty in walking, not elsewhere classified  Rationale for Evaluation and Treatment Rehabilitation  PERTINENT HISTORY: see above  PRECAUTIONS: none  SUBJECTIVE: Patient arrives with interpreter.  Patient made aware of [employer] plan ending August 31st.  Patient states she has new insurance that will start after [employer] plan ends.  She reports her pain at 6/10.  She states her pain in less frequent and the right leg pain is more intermittent.    PAIN:  Are you having pain? Yes: NPRS scale: 6/10 Pain location: low back Pain description: aching Aggravating factors: standing, doing housework Relieving factors: meds, rest   OBJECTIVE: (objective measures completed at initial evaluation unless otherwise dated)  OBJECTIVE:    DIAGNOSTIC FINDINGS:  Appears MRI was ordered in April   PATIENT SURVEYS:  FOTO 50 (goal is 78)   SCREENING FOR RED FLAGS: Bowel or bladder incontinence:  No Spinal tumors: No Cauda equina syndrome: No Compression fracture: No Abdominal aneurysm: No   COGNITION:           Overall cognitive status: Within functional limits for tasks assessed                          SENSATION: Radicular symptoms present right LE along L4-L5    MUSCLE LENGTH: Hamstrings: Right 60 deg; Left 65 deg Thomas test: Right pos ; Left pos    POSTURE: increased lumbar lordosis     LUMBAR ROM:    Active  A/PROM  eval  Flexion WNL  Extension WNL  Right lateral flexion To just above joint line  Left lateral flexion To just above joint line  Right rotation WNL  Left rotation WNL   (Blank rows = not tested)   LOWER EXTREMITY ROM:      WFL   LOWER EXTREMITY MMT:     Generally 4 to 4+/5   LUMBAR SPECIAL TESTS:  Straight leg raise test: Positive   GAIT: Distance walked: 50 Assistive device utilized: None Level of assistance: Complete Independence Comments: antalgic slow guarded       TODAY'S TREATMENT:  06/20/22 NuStep x 5 min level 5 PT present to discuss progress (interpreter present) Supine hamstring stretch 3 x 20 seconds each  Supine ITB stretch 3 x 20 seconds each PPT x 20 PPT with alternating arm and leg PPT with SLR and physioball shoulder flexion x  20 each LE PPT with physio ball pass (bilateral hands to feet) 2 x 10 PPT with clamshell in hook lying x 20 with yellow loop Plank on elbows 5 x 20 sec Prone on elbows x 2 min Prone press x 10 hold 2-3 seconds at top Ice to low back x 10 min in hook lying  TODAY'S TREATMENT:  06/08/22 NuStep x 5 min level 3 PT present to discuss progress Reviewed HEP Added core exercises as follows:  PPT  PPT with alternating arm and leg PPT with SLR and physioball shoulder flexion x 20 each LE PPT with clamshell in hook lying x 20 Ice to low back x 10 min in hook lying  TODAY'S TREATMENT : 06/06/22 Initial eval completed and initiated HEP     PATIENT EDUCATION:  Education details: Reviewed  HEP, added core stabilization Person educated: Patient Education method: Explanation, Demonstration, Verbal cues, and Handouts Education comprehension: verbalized understanding, returned demonstration, and verbal cues required     HOME EXERCISE PROGRAM: Access Code: [email or web address] URL: [email or web address] Date: 06/08/2022 Prepared by: Cynthia Lamb  Exercises - Supine Hamstring Stretch with Strap  - 1 x daily - 7 x weekly - 3 sets - 10 reps - Lying Prone  - 1 x daily - 7 x weekly - 1 sets - 1 reps - 2 min hold - Static Prone on Elbows  - 1 x daily - 7 x weekly - 1 sets - 1 reps - Prone Push Ups on Forearms  - 1 x daily - 7 x weekly - 1 sets - 10 reps - Supine Posterior Pelvic Tilt  - 1 x daily - 7 x weekly - 1 sets - 20 reps - Dead Bug  - 1 x daily - 7 x weekly - 1 sets - 20 reps - Supine Pelvic Tilt with Straight Leg Raise  - 1 x daily - 7 x weekly - 2 sets - 10 reps - Hooklying Clamshell with Resistance  - 1 x daily - 7 x weekly - 1 sets - 20 reps   ASSESSMENT:   CLINICAL IMPRESSION: Cynthia Lamb is progressing appropriately.   She was able to tolerate addition of core stabilization exercises without increased pain but fatigued easily with ball pass and had to rest several times.    She would benefit from continued skilled PT for LE stretching, core strengthening and pain control.       OBJECTIVE IMPAIRMENTS difficulty walking, decreased ROM, decreased strength, increased fascial restrictions, increased muscle spasms, impaired flexibility, impaired sensation, improper body mechanics, postural dysfunction, and pain.    ACTIVITY LIMITATIONS carrying, lifting, bending, standing, squatting, sleeping, and transfers   PARTICIPATION LIMITATIONS: meal prep, cleaning, laundry, driving, shopping, community activity, occupation, and yard work   PERSONAL FACTORS Education and Past/current experiences are also affecting patient's functional outcome.    REHAB POTENTIAL: Good    CLINICAL DECISION MAKING: Stable/uncomplicated   EVALUATION COMPLEXITY: Low     GOALS: Goals reviewed with patient? Yes   SHORT TERM GOALS: Target date: 07/04/2022   Pain report to be no greater than 4/10  Baseline: Goal status: on going   2.  Patient will be independent with initial HEP  Baseline:  Goal status: MET   LONG TERM GOALS: Target date: 08/01/2022   Patient to be independent with advanced HEP  Baseline:  Goal status: INITIAL   2.  Patient to report pain no greater than 2/10  Baseline:  Goal status: INITIAL   3.  Radicular symptoms to centralize Baseline:  Goal status: INITIAL       PLAN: PT FREQUENCY: 2x/week   PT DURATION: 8 weeks   PLANNED INTERVENTIONS: Therapeutic exercises, Therapeutic activity, Neuromuscular re-education, Balance training, Gait training, Patient/Family education, Joint mobilization, Aquatic Therapy, Dry Needling, Electrical stimulation, Spinal mobilization, Cryotherapy, Moist heat, Taping, Traction, Ultrasound, Manual therapy, and Re-evaluation.   PLAN FOR NEXT SESSION: Review HEP, NuStep, assess effectiveness of McKenzie.       Anderson Malta B. Grantley Savage, PT 06/20/22 10:49 AM  [facility] [address], [place], [place] [address] Phone # [phone] Fax [phone]

## 2022-06-22 ENCOUNTER — Ambulatory Visit: Payer: 59

## 2022-06-22 DIAGNOSIS — R262 Difficulty in walking, not elsewhere classified: Secondary | ICD-10-CM

## 2022-06-22 DIAGNOSIS — R252 Cramp and spasm: Secondary | ICD-10-CM

## 2022-06-22 DIAGNOSIS — R293 Abnormal posture: Secondary | ICD-10-CM

## 2022-06-22 DIAGNOSIS — M6281 Muscle weakness (generalized): Secondary | ICD-10-CM

## 2022-06-22 DIAGNOSIS — M5416 Radiculopathy, lumbar region: Secondary | ICD-10-CM | POA: Diagnosis not present

## 2022-06-22 NOTE — Therapy (Signed)
OUTPATIENT PHYSICAL THERAPY TREATMENT NOTE   Patient Name: Cynthia Lamb MRN: [id number] DOB:May 23, 1961, 61 y.o., female Today's Date: 06/22/2022  PCP: does not have PCP REFERRING PROVIDER:   Vanetta Mulders, MD    END OF SESSION:   PT End of Session - 06/22/22 1012     Visit Number 4    Date for PT Re-Evaluation 08/01/22    Authorization Type [employer]    PT Start Time 1012    PT Stop Time 1059    PT Time Calculation (min) 47 min    Activity Tolerance Patient tolerated treatment well    Behavior During Therapy [facility] for tasks assessed/performed             History reviewed. No pertinent past medical history. History reviewed. No pertinent surgical history. Patient Active Problem List   Diagnosis Date Noted   Radiculopathy, lumbar region 06/06/2022    REFERRING DIAG: M51.26 (ICD-10-CM) - Herniation of right side of L4-L5 intervertebral disc   THERAPY DIAG:  Radiculopathy, lumbar region  Abnormal posture  Cramp and spasm  Muscle weakness (generalized)  Difficulty in walking, not elsewhere classified  Rationale for Evaluation and Treatment Rehabilitation  PERTINENT HISTORY: see above  PRECAUTIONS: none  SUBJECTIVE: Patient arrives with interpreter.   She reports her pain at 6/10.  She states her pain in less frequent and the right leg pain is more intermittent.    PAIN:  Are you having pain? Yes: NPRS scale: 6/10 Pain location: low back Pain description: aching Aggravating factors: standing, doing housework Relieving factors: meds, rest   OBJECTIVE: (objective measures completed at initial evaluation unless otherwise dated)  OBJECTIVE:    DIAGNOSTIC FINDINGS:  Appears MRI was ordered in April   PATIENT SURVEYS:  FOTO 50 (goal is 61)   SCREENING FOR RED FLAGS: Bowel or bladder incontinence: No Spinal tumors: No Cauda equina syndrome: No Compression fracture: No Abdominal aneurysm: No   COGNITION:           Overall cognitive  status: Within functional limits for tasks assessed                          SENSATION: Radicular symptoms present right LE along L4-L5    MUSCLE LENGTH: Hamstrings: Right 60 deg; Left 65 deg Thomas test: Right pos ; Left pos    POSTURE: increased lumbar lordosis     LUMBAR ROM:    Active  A/PROM  eval  Flexion WNL  Extension WNL  Right lateral flexion To just above joint line  Left lateral flexion To just above joint line  Right rotation WNL  Left rotation WNL   (Blank rows = not tested)   LOWER EXTREMITY ROM:      WFL   LOWER EXTREMITY MMT:     Generally 4 to 4+/5   LUMBAR SPECIAL TESTS:  Straight leg raise test: Positive   GAIT: Distance walked: 50 Assistive device utilized: None Level of assistance: Complete Independence Comments: antalgic slow guarded       TODAY'S TREATMENT:  06/22/22 NuStep x 5 min level 5 PT present to discuss progress (interpreter present) Supine hamstring stretch 3 x 20 seconds each (had some pain in right leg today with hamstring stretches; had patient modify angle Supine ITB stretch 3 x 20 seconds each Piriformis stretch 3 x 20 seconds on right only PPT x 20 PPT with alternating arm and leg PPT with SLR and physioball shoulder flexion x 20 each LE  PPT with physio ball pass (bilateral hands to feet) 2 x 10 PPT with clamshell in hook lying x 20 with yellow loop Quadruped alternate arm and leg with cone on back to promote trunk stability x 20 Prone on elbows x 2 min Prone press x 10 hold 2-3 seconds at top Ice to low back x 10 min in hook lying  TODAY'S TREATMENT:  06/20/22 NuStep x 5 min level 5 PT present to discuss progress (interpreter present) Supine hamstring stretch 3 x 20 seconds each  Supine ITB stretch 3 x 20 seconds each PPT x 20 PPT with alternating arm and leg PPT with SLR and physioball shoulder flexion x 20 each LE PPT with physio ball pass (bilateral hands to feet) 2 x 10 PPT with clamshell in hook lying x 20  with yellow loop Plank on elbows 5 x 20 sec Prone on elbows x 2 min Prone press x 10 hold 2-3 seconds at top Ice to low back x 10 min in hook lying  TODAY'S TREATMENT:  06/08/22 NuStep x 5 min level 3 PT present to discuss progress Reviewed HEP Added core exercises as follows:  PPT  PPT with alternating arm and leg PPT with SLR and physioball shoulder flexion x 20 each LE PPT with clamshell in hook lying x 20 Ice to low back x 10 min in hook lying    PATIENT EDUCATION:  Education details: Reviewed HEP, added core stabilization Person educated: Patient Education method: Consulting civil engineer, Demonstration, Verbal cues, and Handouts Education comprehension: verbalized understanding, returned demonstration, and verbal cues required     HOME EXERCISE PROGRAM: Access Code: [email or web address] URL: [email or web address] Date: 06/08/2022 Prepared by: Candyce Churn  Exercises - Supine Hamstring Stretch with Strap  - 1 x daily - 7 x weekly - 3 sets - 10 reps - Lying Prone  - 1 x daily - 7 x weekly - 1 sets - 1 reps - 2 min hold - Static Prone on Elbows  - 1 x daily - 7 x weekly - 1 sets - 1 reps - Prone Push Ups on Forearms  - 1 x daily - 7 x weekly - 1 sets - 10 reps - Supine Posterior Pelvic Tilt  - 1 x daily - 7 x weekly - 1 sets - 20 reps - Dead Bug  - 1 x daily - 7 x weekly - 1 sets - 20 reps - Supine Pelvic Tilt with Straight Leg Raise  - 1 x daily - 7 x weekly - 2 sets - 10 reps - Hooklying Clamshell with Resistance  - 1 x daily - 7 x weekly - 1 sets - 20 reps   ASSESSMENT:   CLINICAL IMPRESSION: Cynthia Lamb is progressing appropriately.   She had some pain with right hamstring stretching today but this was relieved with slight knee bend to isolate the hamstring vs. Neural stretch.  She struggled with quadruped alt arm and leg needing heavy verbal cues to maintain trunk stability.  She was unable to avoid cone on her back toppling off initially but with heavy verbal cues, she did 4  reps without losing cone.    She would benefit from continued skilled PT for LE stretching, core strengthening and pain control.       OBJECTIVE IMPAIRMENTS difficulty walking, decreased ROM, decreased strength, increased fascial restrictions, increased muscle spasms, impaired flexibility, impaired sensation, improper body mechanics, postural dysfunction, and pain.    ACTIVITY LIMITATIONS carrying, lifting, bending, standing, squatting, sleeping, and transfers   PARTICIPATION LIMITATIONS:  meal prep, cleaning, laundry, driving, shopping, community activity, occupation, and yard work   PERSONAL FACTORS Education and Past/current experiences are also affecting patient's functional outcome.    REHAB POTENTIAL: Good   CLINICAL DECISION MAKING: Stable/uncomplicated   EVALUATION COMPLEXITY: Low     GOALS: Goals reviewed with patient? Yes   SHORT TERM GOALS: Target date: 07/04/2022   Pain report to be no greater than 4/10  Baseline: Goal status: on going   2.  Patient will be independent with initial HEP  Baseline:  Goal status: MET   LONG TERM GOALS: Target date: 08/01/2022   Patient to be independent with advanced HEP  Baseline:  Goal status: INITIAL   2.  Patient to report pain no greater than 2/10  Baseline:  Goal status: INITIAL   3.  Radicular symptoms to centralize Baseline:  Goal status: INITIAL       PLAN: PT FREQUENCY: 2x/week   PT DURATION: 8 weeks   PLANNED INTERVENTIONS: Therapeutic exercises, Therapeutic activity, Neuromuscular re-education, Balance training, Gait training, Patient/Family education, Joint mobilization, Aquatic Therapy, Dry Needling, Electrical stimulation, Spinal mobilization, Cryotherapy, Moist heat, Taping, Traction, Ultrasound, Manual therapy, and Re-evaluation.   PLAN FOR NEXT SESSION: NuStep, progress core stabilization, continue flexibility exercises and ice for pain control.        Anderson Malta B. Loyd Salvador, PT 06/22/22 10:56 AM   [facility] [address], [place], [place] [address] Phone # [phone] Fax [phone]

## 2022-06-27 ENCOUNTER — Encounter: Payer: Self-pay | Admitting: Rehabilitative and Restorative Service Providers"

## 2022-06-29 ENCOUNTER — Ambulatory Visit: Payer: 59

## 2022-06-29 DIAGNOSIS — M5416 Radiculopathy, lumbar region: Secondary | ICD-10-CM

## 2022-06-29 DIAGNOSIS — R262 Difficulty in walking, not elsewhere classified: Secondary | ICD-10-CM

## 2022-06-29 DIAGNOSIS — M6281 Muscle weakness (generalized): Secondary | ICD-10-CM

## 2022-06-29 DIAGNOSIS — R293 Abnormal posture: Secondary | ICD-10-CM

## 2022-06-29 DIAGNOSIS — R252 Cramp and spasm: Secondary | ICD-10-CM

## 2022-06-29 NOTE — Therapy (Signed)
OUTPATIENT PHYSICAL THERAPY TREATMENT NOTE   Patient Name: Cynthia Lamb MRN: [id number] DOB:[birth year], 61 y.o., female Today's Date: 06/29/2022  PCP: does not have PCP REFERRING PROVIDER:   Vanetta Mulders, MD    END OF SESSION:   PT End of Session - 06/29/22 1447     Visit Number 5    Date for PT Re-Evaluation 08/01/22    Authorization Type [employer]    PT Start Time 1224    PT Stop Time 1526    PT Time Calculation (min) 39 min    Activity Tolerance Patient limited by pain    Behavior During Therapy [facility] for tasks assessed/performed             History reviewed. No pertinent past medical history. History reviewed. No pertinent surgical history. Patient Active Problem List   Diagnosis Date Noted   Radiculopathy, lumbar region 06/06/2022    REFERRING DIAG: M51.26 (ICD-10-CM) - Herniation of right side of L4-L5 intervertebral disc   THERAPY DIAG:  Radiculopathy, lumbar region  Abnormal posture  Cramp and spasm  Muscle weakness (generalized)  Difficulty in walking, not elsewhere classified  Rationale for Evaluation and Treatment Rehabilitation  PERTINENT HISTORY: see above  PRECAUTIONS: none  SUBJECTIVE: Patient arrives with interpreter.   She reports her pain at 6/10.  She continues to report less overall pain and leg symptom less frequent.    PAIN:  Are you having pain? Yes: NPRS scale: 6/10 Pain location: low back Pain description: aching Aggravating factors: standing, doing housework Relieving factors: meds, rest   OBJECTIVE: (objective measures completed at initial evaluation unless otherwise dated)  OBJECTIVE:    DIAGNOSTIC FINDINGS:  Appears MRI was ordered in April   PATIENT SURVEYS:  FOTO 50 (goal is 7)   SCREENING FOR RED FLAGS: Bowel or bladder incontinence: No Spinal tumors: No Cauda equina syndrome: No Compression fracture: No Abdominal aneurysm: No   COGNITION:           Overall cognitive status: Within  functional limits for tasks assessed                          SENSATION: Radicular symptoms present right LE along L4-L5    MUSCLE LENGTH: Hamstrings: Right 60 deg; Left 65 deg Thomas test: Right pos ; Left pos    POSTURE: increased lumbar lordosis     LUMBAR ROM:    Active  A/PROM  eval  Flexion WNL  Extension WNL  Right lateral flexion To just above joint line  Left lateral flexion To just above joint line  Right rotation WNL  Left rotation WNL   (Blank rows = not tested)   LOWER EXTREMITY ROM:      WFL   LOWER EXTREMITY MMT:     Generally 4 to 4+/5   LUMBAR SPECIAL TESTS:  Straight leg raise test: Positive   GAIT: Distance walked: 50 Assistive device utilized: None Level of assistance: Complete Independence Comments: antalgic slow guarded       TODAY'S TREATMENT:  06/29/22 NuStep x 5 min level 5 PT present to discuss progress (interpreter present) Supine hamstring stretch 3 x 20 seconds each (no right leg pain today) Supine ITB stretch 3 x 20 seconds each Piriformis stretch 3 x 20 seconds on right only PPT x 20 PPT with alternating arm and leg PPT with SLR and yellow plyo ball shoulder flexion x 20 each LE PPT with physio ball pass (bilateral hands to feet) 2  x 10 PPT bridge with clamshell in hook lying x 20 with yellow loop Quadruped alternate arm and leg with cone on back to promote trunk stability x 20 Prone on elbows x 2 min Prone press x 10 hold 2-3 seconds at top Ice to low back x 10 min in hook lying TODAY'S TREATMENT:  06/22/22 NuStep x 5 min level 5 PT present to discuss progress (interpreter present) Supine hamstring stretch 3 x 20 seconds each (had some pain in right leg today with hamstring stretches; had patient modify angle Supine ITB stretch 3 x 20 seconds each Piriformis stretch 3 x 20 seconds on right only PPT x 20 PPT with alternating arm and leg PPT with SLR and physioball shoulder flexion x 20 each LE PPT with physio ball pass  (bilateral hands to feet) 2 x 10 PPT with clamshell in hook lying x 20 with yellow loop Quadruped alternate arm and leg with cone on back to promote trunk stability x 20 Prone on elbows x 2 min Prone press x 10 hold 2-3 seconds at top Ice to low back x 10 min in hook lying  TODAY'S TREATMENT:  06/20/22 NuStep x 5 min level 5 PT present to discuss progress (interpreter present) Supine hamstring stretch 3 x 20 seconds each  Supine ITB stretch 3 x 20 seconds each PPT x 20 PPT with alternating arm and leg PPT with SLR and physioball shoulder flexion x 20 each LE PPT with physio ball pass (bilateral hands to feet) 2 x 10 PPT with clamshell in hook lying x 20 with yellow loop Plank on elbows 5 x 20 sec Prone on elbows x 2 min Prone press x 10 hold 2-3 seconds at top Ice to low back x 10 min in hook lying    PATIENT EDUCATION:  Education details: Reviewed HEP, added core stabilization Person educated: Patient Education method: Consulting civil engineer, Demonstration, Verbal cues, and Handouts Education comprehension: verbalized understanding, returned demonstration, and verbal cues required     HOME EXERCISE PROGRAM: Access Code: [email or web address] URL: [email or web address] Date: 06/08/2022 Prepared by: Candyce Churn  Exercises - Supine Hamstring Stretch with Strap  - 1 x daily - 7 x weekly - 3 sets - 10 reps - Lying Prone  - 1 x daily - 7 x weekly - 1 sets - 1 reps - 2 min hold - Static Prone on Elbows  - 1 x daily - 7 x weekly - 1 sets - 1 reps - Prone Push Ups on Forearms  - 1 x daily - 7 x weekly - 1 sets - 10 reps - Supine Posterior Pelvic Tilt  - 1 x daily - 7 x weekly - 1 sets - 20 reps - Dead Bug  - 1 x daily - 7 x weekly - 1 sets - 20 reps - Supine Pelvic Tilt with Straight Leg Raise  - 1 x daily - 7 x weekly - 2 sets - 10 reps - Hooklying Clamshell with Resistance  - 1 x daily - 7 x weekly - 1 sets - 20 reps   ASSESSMENT:   CLINICAL IMPRESSION: Cynthia Lamb is continuing to  tolerate more aggressive core strengthening without increased pain.  Leg symptoms are centralizing.   She would benefit from continued skilled PT for LE stretching, core strengthening and pain control.       OBJECTIVE IMPAIRMENTS difficulty walking, decreased ROM, decreased strength, increased fascial restrictions, increased muscle spasms, impaired flexibility, impaired sensation, improper body mechanics, postural dysfunction, and pain.    ACTIVITY  LIMITATIONS carrying, lifting, bending, standing, squatting, sleeping, and transfers   PARTICIPATION LIMITATIONS: meal prep, cleaning, laundry, driving, shopping, community activity, occupation, and yard work   PERSONAL FACTORS Education and Past/current experiences are also affecting patient's functional outcome.    REHAB POTENTIAL: Good   CLINICAL DECISION MAKING: Stable/uncomplicated   EVALUATION COMPLEXITY: Low     GOALS: Goals reviewed with patient? Yes   SHORT TERM GOALS: Target date: 07/04/2022   Pain report to be no greater than 4/10  Baseline: Goal status: on going   2.  Patient will be independent with initial HEP  Baseline:  Goal status: MET   LONG TERM GOALS: Target date: 08/01/2022   Patient to be independent with advanced HEP  Baseline:  Goal status: INITIAL   2.  Patient to report pain no greater than 2/10  Baseline:  Goal status: INITIAL   3.  Radicular symptoms to centralize Baseline:  Goal status: INITIAL       PLAN: PT FREQUENCY: 2x/week   PT DURATION: 8 weeks   PLANNED INTERVENTIONS: Therapeutic exercises, Therapeutic activity, Neuromuscular re-education, Balance training, Gait training, Patient/Family education, Joint mobilization, Aquatic Therapy, Dry Needling, Electrical stimulation, Spinal mobilization, Cryotherapy, Moist heat, Taping, Traction, Ultrasound, Manual therapy, and Re-evaluation.   PLAN FOR NEXT SESSION: NuStep, progress core stabilization, continue flexibility exercises and ice for  pain control.        Anderson Malta B. Blonnie Maske, PT 06/29/22 3:30 PM  [place] [address] [place], [place] [address] Phone # [phone] Fax [phone]

## 2022-07-04 ENCOUNTER — Ambulatory Visit: Payer: 59 | Admitting: Rehabilitative and Restorative Service Providers"

## 2022-07-04 ENCOUNTER — Encounter: Payer: Self-pay | Admitting: Rehabilitative and Restorative Service Providers"

## 2022-07-04 DIAGNOSIS — M5416 Radiculopathy, lumbar region: Secondary | ICD-10-CM | POA: Diagnosis not present

## 2022-07-04 DIAGNOSIS — R262 Difficulty in walking, not elsewhere classified: Secondary | ICD-10-CM

## 2022-07-04 DIAGNOSIS — M6281 Muscle weakness (generalized): Secondary | ICD-10-CM

## 2022-07-04 DIAGNOSIS — R293 Abnormal posture: Secondary | ICD-10-CM

## 2022-07-04 DIAGNOSIS — R252 Cramp and spasm: Secondary | ICD-10-CM

## 2022-07-04 NOTE — Therapy (Signed)
OUTPATIENT PHYSICAL THERAPY TREATMENT NOTE   Patient Name: Cynthia Lamb MRN: [id number] DOB:[birth year], 61 y.o., female Today's Date: 07/04/2022  PCP: does not have PCP REFERRING PROVIDER:   Vanetta Mulders, MD    END OF SESSION:   PT End of Session - 07/04/22 1450     Visit Number 6    Date for PT Re-Evaluation 08/01/22    Authorization Type [employer]    PT Start Time 0932    PT Stop Time 1525    PT Time Calculation (min) 40 min    Activity Tolerance Patient limited by pain    Behavior During Therapy [facility] for tasks assessed/performed             History reviewed. No pertinent past medical history. History reviewed. No pertinent surgical history. Patient Active Problem List   Diagnosis Date Noted   Radiculopathy, lumbar region 06/06/2022    REFERRING DIAG: M51.26 (ICD-10-CM) - Herniation of right side of L4-L5 intervertebral disc   THERAPY DIAG:  Radiculopathy, lumbar region  Abnormal posture  Cramp and spasm  Muscle weakness (generalized)  Difficulty in walking, not elsewhere classified  Rationale for Evaluation and Treatment Rehabilitation  PERTINENT HISTORY: see above  PRECAUTIONS: none  SUBJECTIVE: Patient arrives with interpreter.   Pt denies pain today.   PAIN:  Are you having pain? Yes: NPRS scale: 0/10 Pain location: low back Pain description: aching Aggravating factors: standing, doing housework Relieving factors: meds, rest   OBJECTIVE: (objective measures completed at initial evaluation unless otherwise dated)  OBJECTIVE:    DIAGNOSTIC FINDINGS:  Appears MRI was ordered in April   PATIENT SURVEYS:  FOTO 50 (goal is 97)             SENSATION: Radicular symptoms present right LE along L4-L5    MUSCLE LENGTH: Hamstrings: Right 60 deg; Left 65 deg Thomas test: Right pos ; Left pos    POSTURE: increased lumbar lordosis     LUMBAR ROM:    Active  A/PROM  eval  Flexion WNL  Extension WNL  Right lateral flexion  To just above joint line  Left lateral flexion To just above joint line  Right rotation WNL  Left rotation WNL   (Blank rows = not tested)   LOWER EXTREMITY ROM:      WFL   LOWER EXTREMITY MMT:     Generally 4 to 4+/5   LUMBAR SPECIAL TESTS:  Straight leg raise test: Positive   GAIT: Distance walked: 50 Assistive device utilized: None Level of assistance: Complete Independence Comments: antalgic slow guarded       TODAY'S TREATMENT:    07/04/2022: NuStep x 6 min level 5 PT present to discuss progress (interpreter present) 3 way green pball rollout 5x10 sec each Supine hamstring stretch with strap 2x20 sec bilat Hip adductor and IT band stretch with strap in supine 2x20 sec bilat each Prone press-up 2x10 Prone alt UE/LE Sit to/from stand holding 5# kettlebell:  x10 with chest press, x10 with overhead press Standing extension x10 4 way pelvic tilts seated on dynadisc x20 each Modified dead-lift holding 7# dumbbell 2x10  06/29/22 NuStep x 5 min level 5 PT present to discuss progress (interpreter present) Supine hamstring stretch 3 x 20 seconds each (no right leg pain today) Supine ITB stretch 3 x 20 seconds each Piriformis stretch 3 x 20 seconds on right only PPT x 20 PPT with alternating arm and leg PPT with SLR and yellow plyo ball shoulder flexion x 20 each  LE PPT with physio ball pass (bilateral hands to feet) 2 x 10 PPT bridge with clamshell in hook lying x 20 with yellow loop Quadruped alternate arm and leg with cone on back to promote trunk stability x 20 Prone on elbows x 2 min Prone press x 10 hold 2-3 seconds at top Ice to low back x 10 min in hook lying  06/22/22 NuStep x 5 min level 5 PT present to discuss progress (interpreter present) Supine hamstring stretch 3 x 20 seconds each (had some pain in right leg today with hamstring stretches; had patient modify angle Supine ITB stretch 3 x 20 seconds each Piriformis stretch 3 x 20 seconds on right  only PPT x 20 PPT with alternating arm and leg PPT with SLR and physioball shoulder flexion x 20 each LE PPT with physio ball pass (bilateral hands to feet) 2 x 10 PPT with clamshell in hook lying x 20 with yellow loop Quadruped alternate arm and leg with cone on back to promote trunk stability x 20 Prone on elbows x 2 min Prone press x 10 hold 2-3 seconds at top Ice to low back x 10 min in hook lying     PATIENT EDUCATION:  Education details: Reviewed HEP, added core stabilization Person educated: Patient Education method: Consulting civil engineer, Demonstration, Verbal cues, and Handouts Education comprehension: verbalized understanding, returned demonstration, and verbal cues required     HOME EXERCISE PROGRAM: Access Code: [email or web address] URL: [email or web address] Date: 06/08/2022 Prepared by: Candyce Churn  Exercises - Supine Hamstring Stretch with Strap  - 1 x daily - 7 x weekly - 3 sets - 10 reps - Lying Prone  - 1 x daily - 7 x weekly - 1 sets - 1 reps - 2 min hold - Static Prone on Elbows  - 1 x daily - 7 x weekly - 1 sets - 1 reps - Prone Push Ups on Forearms  - 1 x daily - 7 x weekly - 1 sets - 10 reps - Supine Posterior Pelvic Tilt  - 1 x daily - 7 x weekly - 1 sets - 20 reps - Dead Bug  - 1 x daily - 7 x weekly - 1 sets - 20 reps - Supine Pelvic Tilt with Straight Leg Raise  - 1 x daily - 7 x weekly - 2 sets - 10 reps - Hooklying Clamshell with Resistance  - 1 x daily - 7 x weekly - 1 sets - 20 reps   ASSESSMENT:   CLINICAL IMPRESSION: Sorah presented to skilled PT today without any pain.  Pt was able to tolerate session with only minimal pain reported with prone alt LE/UE extension, but the pain quickly resolved upon completion of exercise.  Otherwise, pt denied pain throughout and tolerated session well.  Pt only requires min cuing throughout for body mechanics and posture.  Pt continues to require skilled PT to progress towards goal related activities.      OBJECTIVE IMPAIRMENTS difficulty walking, decreased ROM, decreased strength, increased fascial restrictions, increased muscle spasms, impaired flexibility, impaired sensation, improper body mechanics, postural dysfunction, and pain.    ACTIVITY LIMITATIONS carrying, lifting, bending, standing, squatting, sleeping, and transfers   PARTICIPATION LIMITATIONS: meal prep, cleaning, laundry, driving, shopping, community activity, occupation, and yard work   PERSONAL FACTORS Education and Past/current experiences are also affecting patient's functional outcome.    REHAB POTENTIAL: Good   CLINICAL DECISION MAKING: Stable/uncomplicated   EVALUATION COMPLEXITY: Low     GOALS: Goals reviewed with patient? Yes  SHORT TERM GOALS: Target date: 07/04/2022   Pain report to be no greater than 4/10  Baseline: Goal status: Goal met 07/04/2022   2.  Patient will be independent with initial HEP  Baseline:  Goal status: MET   LONG TERM GOALS: Target date: 08/01/2022   Patient to be independent with advanced HEP  Baseline:  Goal status: INITIAL   2.  Patient to report pain no greater than 2/10  Baseline:  Goal status: INITIAL   3.  Radicular symptoms to centralize Baseline:  Goal status: INITIAL       PLAN: PT FREQUENCY: 2x/week   PT DURATION: 8 weeks   PLANNED INTERVENTIONS: Therapeutic exercises, Therapeutic activity, Neuromuscular re-education, Balance training, Gait training, Patient/Family education, Joint mobilization, Aquatic Therapy, Dry Needling, Electrical stimulation, Spinal mobilization, Cryotherapy, Moist heat, Taping, Traction, Ultrasound, Manual therapy, and Re-evaluation.   PLAN FOR NEXT SESSION: NuStep, progress core stabilization, continue flexibility exercises and ice for pain control.        Juel Burrow, PT 07/04/22 3:41 PM   [facility] [address] [place], [place] [address] Phone # [phone] Fax [phone]

## 2022-07-11 ENCOUNTER — Ambulatory Visit: Payer: 59 | Admitting: Rehabilitative and Restorative Service Providers"

## 2022-07-18 ENCOUNTER — Ambulatory Visit: Payer: 59 | Attending: Orthopaedic Surgery

## 2022-07-18 DIAGNOSIS — R262 Difficulty in walking, not elsewhere classified: Secondary | ICD-10-CM | POA: Diagnosis present

## 2022-07-18 DIAGNOSIS — R252 Cramp and spasm: Secondary | ICD-10-CM | POA: Diagnosis present

## 2022-07-18 DIAGNOSIS — M5416 Radiculopathy, lumbar region: Secondary | ICD-10-CM | POA: Diagnosis present

## 2022-07-18 DIAGNOSIS — R293 Abnormal posture: Secondary | ICD-10-CM | POA: Insufficient documentation

## 2022-07-18 DIAGNOSIS — M6281 Muscle weakness (generalized): Secondary | ICD-10-CM | POA: Insufficient documentation

## 2022-07-18 NOTE — Therapy (Signed)
OUTPATIENT PHYSICAL THERAPY TREATMENT NOTE   Patient Name: Cynthia Lamb MRN: [id number] DOB:Feb 07, 1961, 61 y.o., female Today's Date: 07/19/2022  PCP: does not have PCP REFERRING PROVIDER:   Vanetta Mulders, MD    END OF SESSION:   PT End of Session - 07/18/22 1458     Visit Number 7    Date for PT Re-Evaluation 08/01/22    Authorization Type [employer]    PT Start Time 1450    PT Stop Time 1530    PT Time Calculation (min) 40 min    Activity Tolerance Patient limited by pain    Behavior During Therapy [facility] for tasks assessed/performed             History reviewed. No pertinent past medical history. History reviewed. No pertinent surgical history. Patient Active Problem List   Diagnosis Date Noted   Radiculopathy, lumbar region 06/06/2022    REFERRING DIAG: M51.26 (ICD-10-CM) - Herniation of right side of L4-L5 intervertebral disc   THERAPY DIAG:  Radiculopathy, lumbar region  Abnormal posture  Cramp and spasm  Muscle weakness (generalized)  Difficulty in walking, not elsewhere classified  Rationale for Evaluation and Treatment Rehabilitation  PERTINENT HISTORY: see above  PRECAUTIONS: none  SUBJECTIVE: Patient arrives with interpreter.   Pt denies pain today.   PAIN:  Are you having pain? Yes: NPRS scale: 0/10 Pain location: low back Pain description: aching Aggravating factors: standing, doing housework Relieving factors: meds, rest   OBJECTIVE: (objective measures completed at initial evaluation unless otherwise dated)  OBJECTIVE:    DIAGNOSTIC FINDINGS:  Appears MRI was ordered in April   PATIENT SURVEYS:  FOTO 50 (goal is 13)             SENSATION: Radicular symptoms present right LE along L4-L5    MUSCLE LENGTH: Hamstrings: Right 60 deg; Left 65 deg Thomas test: Right pos ; Left pos    POSTURE: increased lumbar lordosis     LUMBAR ROM:    Active  A/PROM  eval  Flexion WNL  Extension WNL  Right lateral flexion  To just above joint line  Left lateral flexion To just above joint line  Right rotation WNL  Left rotation WNL   (Blank rows = not tested)   LOWER EXTREMITY ROM:      WFL   LOWER EXTREMITY MMT:     Generally 4 to 4+/5   LUMBAR SPECIAL TESTS:  Straight leg raise test: Positive   GAIT: Distance walked: 50 Assistive device utilized: None Level of assistance: Complete Independence Comments: antalgic slow guarded       TODAY'S TREATMENT:    07/18/2022: NuStep x 7 min level 5 PT present to discuss progress (interpreter present) PPT x 20 PPT with dying bug x 20 PPT with dying bug attempted with physio ball but patient could not understand despite heavy verbal and tactile cues from PT and interpreter PPT with physio ball pass x 10 Supine hamstring stretch 3 x 20 sec both Supine IT band stretch 3 x 20 sec both Supine adductor stretch 3 x 20 sec both Prone on elbows x 2 min Prone press-up 2x10  07/04/2022: NuStep x 6 min level 5 PT present to discuss progress (interpreter present) 3 way green pball rollout 5x10 sec each Supine hamstring stretch with strap 2x20 sec bilat Hip adductor and IT band stretch with strap in supine 2x20 sec bilat each Prone press-up 2x10 Prone alt UE/LE Sit to/from stand holding 5# kettlebell:  x10 with chest press,  x10 with overhead press Standing extension x10 4 way pelvic tilts seated on dynadisc x20 each Modified dead-lift holding 7# dumbbell 2x10  06/29/22 NuStep x 5 min level 5 PT present to discuss progress (interpreter present) Supine hamstring stretch 3 x 20 seconds each (no right leg pain today) Supine ITB stretch 3 x 20 seconds each Piriformis stretch 3 x 20 seconds on right only PPT x 20 PPT with alternating arm and leg PPT with SLR and yellow plyo ball shoulder flexion x 20 each LE PPT with physio ball pass (bilateral hands to feet) 2 x 10 PPT bridge with clamshell in hook lying x 20 with yellow loop Quadruped alternate arm and leg  with cone on back to promote trunk stability x 20 Prone on elbows x 2 min Prone press x 10 hold 2-3 seconds at top Ice to low back x 10 min in hook lying     PATIENT EDUCATION:  Education details: Reviewed HEP, added core stabilization Person educated: Patient Education method: Consulting civil engineer, Demonstration, Verbal cues, and Handouts Education comprehension: verbalized understanding, returned demonstration, and verbal cues required     HOME EXERCISE PROGRAM: Access Code: [email or web address] URL: [email or web address] Date: 06/08/2022 Prepared by: Candyce Churn  Exercises - Supine Hamstring Stretch with Strap  - 1 x daily - 7 x weekly - 3 sets - 10 reps - Lying Prone  - 1 x daily - 7 x weekly - 1 sets - 1 reps - 2 min hold - Static Prone on Elbows  - 1 x daily - 7 x weekly - 1 sets - 1 reps - Prone Push Ups on Forearms  - 1 x daily - 7 x weekly - 1 sets - 10 reps - Supine Posterior Pelvic Tilt  - 1 x daily - 7 x weekly - 1 sets - 20 reps - Dead Bug  - 1 x daily - 7 x weekly - 1 sets - 20 reps - Supine Pelvic Tilt with Straight Leg Raise  - 1 x daily - 7 x weekly - 2 sets - 10 reps - Hooklying Clamshell with Resistance  - 1 x daily - 7 x weekly - 1 sets - 20 reps   ASSESSMENT:   CLINICAL IMPRESSION: Patient is progressing appropriately.  She continues to have some mild intermittent leg pain but overall pain has decreased to 4/10.    Pt continues to require skilled PT to progress towards goal related activities.     OBJECTIVE IMPAIRMENTS difficulty walking, decreased ROM, decreased strength, increased fascial restrictions, increased muscle spasms, impaired flexibility, impaired sensation, improper body mechanics, postural dysfunction, and pain.    ACTIVITY LIMITATIONS carrying, lifting, bending, standing, squatting, sleeping, and transfers   PARTICIPATION LIMITATIONS: meal prep, cleaning, laundry, driving, shopping, community activity, occupation, and yard work   PERSONAL  FACTORS Education and Past/current experiences are also affecting patient's functional outcome.    REHAB POTENTIAL: Good   CLINICAL DECISION MAKING: Stable/uncomplicated   EVALUATION COMPLEXITY: Low     GOALS: Goals reviewed with patient? Yes   SHORT TERM GOALS: Target date: 07/04/2022   Pain report to be no greater than 4/10  Baseline: Goal status: Goal met 07/04/2022   2.  Patient will be independent with initial HEP  Baseline:  Goal status: MET   LONG TERM GOALS: Target date: 08/01/2022   Patient to be independent with advanced HEP  Baseline:  Goal status: INITIAL   2.  Patient to report pain no greater than 2/10  Baseline:  Goal status: INITIAL  3.  Radicular symptoms to centralize Baseline:  Goal status: INITIAL       PLAN: PT FREQUENCY: 2x/week   PT DURATION: 8 weeks   PLANNED INTERVENTIONS: Therapeutic exercises, Therapeutic activity, Neuromuscular re-education, Balance training, Gait training, Patient/Family education, Joint mobilization, Aquatic Therapy, Dry Needling, Electrical stimulation, Spinal mobilization, Cryotherapy, Moist heat, Taping, Traction, Ultrasound, Manual therapy, and Re-evaluation.   PLAN FOR NEXT SESSION: NuStep, progress core stabilization, continue flexibility exercises and ice for pain control.        Anderson Malta B. Drusilla Wampole, PT 07/19/22 12:15 AM   [facility] [address], [place], [place] [address] Phone # [phone] Fax [phone]

## 2022-07-25 ENCOUNTER — Encounter: Payer: Self-pay | Admitting: Rehabilitative and Restorative Service Providers"

## 2022-07-25 ENCOUNTER — Ambulatory Visit: Payer: 59 | Admitting: Rehabilitative and Restorative Service Providers"

## 2022-07-25 DIAGNOSIS — R252 Cramp and spasm: Secondary | ICD-10-CM

## 2022-07-25 DIAGNOSIS — M5416 Radiculopathy, lumbar region: Secondary | ICD-10-CM

## 2022-07-25 DIAGNOSIS — R262 Difficulty in walking, not elsewhere classified: Secondary | ICD-10-CM

## 2022-07-25 DIAGNOSIS — R293 Abnormal posture: Secondary | ICD-10-CM

## 2022-07-25 DIAGNOSIS — M6281 Muscle weakness (generalized): Secondary | ICD-10-CM

## 2022-07-25 NOTE — Therapy (Addendum)
OUTPATIENT PHYSICAL THERAPY TREATMENT NOTE AND LATE ENTRY DISCHARGE SUMMARY   Patient Name: Cynthia Lamb MRN: [id number] DOB:05/01/1961, 61 y.o., female Today's Date: 07/25/2022  PCP: does not have PCP REFERRING PROVIDER:   Vanetta Mulders, MD    END OF SESSION:   PT End of Session - 07/25/22 1446     Visit Number 8    Date for PT Re-Evaluation 08/01/22    Authorization Type [employer]    PT Start Time 1444    PT Stop Time 1523    PT Time Calculation (min) 39 min    Activity Tolerance Patient limited by pain    Behavior During Therapy [facility] for tasks assessed/performed             History reviewed. No pertinent past medical history. History reviewed. No pertinent surgical history. Patient Active Problem List   Diagnosis Date Noted   Radiculopathy, lumbar region 06/06/2022    REFERRING DIAG: M51.26 (ICD-10-CM) - Herniation of right side of L4-L5 intervertebral disc   THERAPY DIAG:  Radiculopathy, lumbar region  Abnormal posture  Cramp and spasm  Muscle weakness (generalized)  Difficulty in walking, not elsewhere classified  Rationale for Evaluation and Treatment Rehabilitation  PERTINENT HISTORY: see above  PRECAUTIONS: none  SUBJECTIVE: Patient arrives with interpreter.   Pt denies pain today, states only occasionally feels discomfort.   PAIN:  Are you having pain? Yes: NPRS scale: 0/10 Pain location: low back Pain description: aching Aggravating factors: standing, doing housework Relieving factors: meds, rest   OBJECTIVE: (objective measures completed at initial evaluation unless otherwise dated)  OBJECTIVE:    DIAGNOSTIC FINDINGS:  Appears MRI was ordered in April   PATIENT SURVEYS:  FOTO 50 (goal is 43)  07/25/2022:  FOTO 60%            SENSATION: Radicular symptoms present right LE along L4-L5    MUSCLE LENGTH: Hamstrings: Right 60 deg; Left 65 deg Thomas test: Right pos ; Left pos    POSTURE: increased lumbar lordosis      LUMBAR ROM:    Active  A/PROM  eval  Flexion WNL  Extension WNL  Right lateral flexion To just above joint line  Left lateral flexion To just above joint line  Right rotation WNL  Left rotation WNL   (Blank rows = not tested)   LOWER EXTREMITY ROM:      WFL   LOWER EXTREMITY MMT:     Generally 4 to 4+/5   LUMBAR SPECIAL TESTS:  Straight leg raise test: Positive   GAIT: Distance walked: 50 Assistive device utilized: None Level of assistance: Complete Independence Comments: antalgic slow guarded       TODAY'S TREATMENT:    07/25/2022: NuStep x 7 min level 5 PT present to discuss progress (interpreter present) PPT x 20 PPT with dying bug x 20 PPT with dying bug and green pball x10 with max cuing PPT with physio ball pass 2x10 Supine hamstring stretch 2 x 20 sec both Supine IT band stretch 2 x 20 sec both Supine adductor stretch 2 x 20 sec both Prone on elbows x 2 min Prone press-up 2x10 Prone alt UE/LE 2x10 Childs pose 2x20 sec Cat/cow 2x10  07/18/2022: NuStep x 7 min level 5 PT present to discuss progress (interpreter present) PPT x 20 PPT with dying bug x 20 PPT with dying bug attempted with physio ball but patient could not understand despite heavy verbal and tactile cues from PT and interpreter PPT with physio ball  pass x 10 Supine hamstring stretch 3 x 20 sec both Supine IT band stretch 3 x 20 sec both Supine adductor stretch 3 x 20 sec both Prone on elbows x 2 min Prone press-up 2x10  07/04/2022: NuStep x 6 min level 5 PT present to discuss progress (interpreter present) 3 way green pball rollout 5x10 sec each Supine hamstring stretch with strap 2x20 sec bilat Hip adductor and IT band stretch with strap in supine 2x20 sec bilat each Prone press-up 2x10 Prone alt UE/LE Sit to/from stand holding 5# kettlebell:  x10 with chest press, x10 with overhead press Standing extension x10 4 way pelvic tilts seated on dynadisc x20 each Modified dead-lift  holding 7# dumbbell 2x10     PATIENT EDUCATION:  Education details: Reviewed HEP, added core stabilization Person educated: Patient Education method: Explanation, Demonstration, Verbal cues, and Handouts Education comprehension: verbalized understanding, returned demonstration, and verbal cues required     HOME EXERCISE PROGRAM: Access Code: [email or web address] URL: [email or web address] Date: 07/25/2022 Prepared by: Shelby Dubin Sanda Dejoy  Exercises - Supine Hamstring Stretch with Strap  - 1 x daily - 7 x weekly - 3 sets - 10 reps - Lying Prone  - 1 x daily - 7 x weekly - 1 sets - 1 reps - 2 min hold - Static Prone on Elbows  - 1 x daily - 7 x weekly - 1 sets - 1 reps - Prone Push Ups on Forearms  - 1 x daily - 7 x weekly - 1 sets - 10 reps - Supine Posterior Pelvic Tilt  - 1 x daily - 7 x weekly - 1 sets - 20 reps - Dead Bug  - 1 x daily - 7 x weekly - 1 sets - 20 reps - Supine Pelvic Tilt with Straight Leg Raise  - 1 x daily - 7 x weekly - 2 sets - 10 reps - Hooklying Clamshell with Resistance  - 1 x daily - 7 x weekly - 1 sets - 20 reps - Cat Cow  - 1 x daily - 7 x weekly - 2 sets - 10 reps - Child's Pose Stretch  - 1 x daily - 7 x weekly - 1 sets - 2 reps - 20 sec hold - Prone Alternating Arm and Leg Lifts  - 1 x daily - 7 x weekly - 2 sets - 10 reps - Standing Lumbar Extension  - 1 x daily - 7 x weekly - 2 sets - 10 reps   ASSESSMENT:   CLINICAL IMPRESSION: Tabita continues to progress towards skilled rehab goals.  Pt denies pain during session today and has improved with her FOTO score.  Pt was able to advance with HEP during session today to continue to progress towards strengthening.  Pt to be reassessed next session with possible discharge pending progress.     OBJECTIVE IMPAIRMENTS difficulty walking, decreased ROM, decreased strength, increased fascial restrictions, increased muscle spasms, impaired flexibility, impaired sensation, improper body mechanics, postural  dysfunction, and pain.    ACTIVITY LIMITATIONS carrying, lifting, bending, standing, squatting, sleeping, and transfers   PARTICIPATION LIMITATIONS: meal prep, cleaning, laundry, driving, shopping, community activity, occupation, and yard work   PERSONAL FACTORS Education and Past/current experiences are also affecting patient's functional outcome.    REHAB POTENTIAL: Good   CLINICAL DECISION MAKING: Stable/uncomplicated   EVALUATION COMPLEXITY: Low     GOALS: Goals reviewed with patient? Yes   SHORT TERM GOALS: Target date: 07/04/2022   Pain report to be no greater than 4/10  Baseline: Goal status: Goal met 07/04/2022   2.  Patient will be independent with initial HEP  Baseline:  Goal status: MET   LONG TERM GOALS: Target date: 08/01/2022   Patient to be independent with advanced HEP  Baseline:  Goal status: Ongoing   2.  Patient to report pain no greater than 2/10  Baseline:  Goal status: Ongoing   3.  Radicular symptoms to centralize Baseline:  Goal status: Goal Met 07/25/2022       PLAN: PT FREQUENCY: 2x/week   PT DURATION: 8 weeks   PLANNED INTERVENTIONS: Therapeutic exercises, Therapeutic activity, Neuromuscular re-education, Balance training, Gait training, Patient/Family education, Joint mobilization, Aquatic Therapy, Dry Needling, Electrical stimulation, Spinal mobilization, Cryotherapy, Moist heat, Taping, Traction, Ultrasound, Manual therapy, and Re-evaluation.   PLAN FOR NEXT SESSION: Reassessment, FOTO, NuStep, progress core stabilization, continue flexibility exercises and ice for pain control.      PHYSICAL THERAPY DISCHARGE SUMMARY  As of 08/11/22, pt has not returned for any further appointments.  Pt discharged at this time.  Patient agrees to discharge. Patient goals were partially met. Patient is being discharged due to not returning since the last visit.    Juel Burrow, PT 07/25/22 3:34 PM   [facility] [address] [place], [place] [address] Phone # [phone] Fax [phone]

## 2022-08-01 ENCOUNTER — Ambulatory Visit: Payer: 59

## 2024-05-21 ENCOUNTER — Ambulatory Visit (INDEPENDENT_AMBULATORY_CARE_PROVIDER_SITE_OTHER): Admitting: Physician Assistant

## 2024-05-21 ENCOUNTER — Encounter: Payer: Self-pay | Admitting: Physician Assistant

## 2024-05-21 VITALS — BP 137/92 | HR 46 | Ht 61.0 in | Wt 159.3 lb

## 2024-05-21 DIAGNOSIS — Z758 Other problems related to medical facilities and other health care: Secondary | ICD-10-CM | POA: Diagnosis not present

## 2024-05-21 DIAGNOSIS — Z603 Acculturation difficulty: Secondary | ICD-10-CM | POA: Diagnosis not present

## 2024-05-21 DIAGNOSIS — Z13220 Encounter for screening for lipoid disorders: Secondary | ICD-10-CM

## 2024-05-21 DIAGNOSIS — I10 Essential (primary) hypertension: Secondary | ICD-10-CM | POA: Diagnosis not present

## 2024-05-21 MED ORDER — TELMISARTAN 80 MG PO TABS: 80.0000 mg | ORAL_TABLET | Freq: Every day | ORAL | 3 refills | Status: DC

## 2024-05-21 NOTE — Progress Notes (Signed)
 Patient ID: Cynthia Lamb, female   DOB: [birth year], 63 y.o.   MRN: [id number]   Cynthia Lamb, is a 63 y.o. female  NUU:[id number]  HKV:[id number]  DOB - January 07, 1961  Chief Complaint  Patient presents with   New Patient (Initial Visit)   blood work        Subjective:   Cynthia Lamb is a 63 y.o. female here today to establish care.  She is micardis 80mg  for BP and has a large supply left from her country.  She denies any new issues or concerns today.  She is fasting.  No other known health problems.  No CP/Dizziness/SOB.  BP OOO ~120s-130/80   No problems updated.  ALLERGIES: No Known Allergies  PAST MEDICAL HISTORY: No past medical history on file.  MEDICATIONS AT HOME: Prior to Admission medications   Medication Sig Start Date End Date Taking? Authorizing Provider  telmisartan (MICARDIS) 80 MG tablet Take 1 tablet (80 mg total) by mouth daily. 05/21/24  Yes Jolyne Laye M, PA-C    ROS: Neg HEENT Neg resp Neg cardiac Neg GI Neg GU Neg MS Neg psych Neg neuro  Objective:   Vitals:   05/21/24 1311  BP: (!) 137/92  Pulse: (!) 46  SpO2: 98%  Weight: 159 lb 5.1 oz (72.3 kg)  Height: 5' 1 (1.549 m)   Exam General appearance : Awake, alert, not in any distress. Speech Clear. Not toxic looking HEENT: Atraumatic and Normocephalic Neck: Supple, no JVD. No cervical lymphadenopathy.  Chest: Good air entry bilaterally, CTAB.  No rales/rhonchi/wheezing CVS: S1 S2 regular, no murmurs.  Extremities: B/L Lower Ext shows no edema, both legs are warm to touch Neurology: Awake alert, and oriented X 3, CN II-XII intact, Non focal Skin: No Rash  Data Review No results found for: HGBA1C  Assessment & Plan   1. Hypertension, unspecified type (Primary) Based on OOO readings she is controlled.  No changes today since stable on meds for a long time - Comprehensive metabolic panel - CBC with Differential - telmisartan (MICARDIS) 80 MG tablet;  Take 1 tablet (80 mg total) by mouth daily.  Dispense: 30 tablet; Refill: 3  2. Language barrier [place] provided an in person interpreter and additional time performing visit was required.   3. Screening cholesterol level She is fasting - Comprehensive metabolic panel - Lipid panel    Return in about 8 weeks (around 07/16/2024) for PCP for chronic conditions.  The patient was given clear instructions to go to ER or return to medical center if symptoms don't improve, worsen or new problems develop. The patient verbalized understanding. The patient was told to call to get lab results if they haven't heard anything in the next week.      Dulce Gibbs, PA-C [facility] [place], [place] [phone]   05/21/2024, 1:39 PM

## 2024-05-22 LAB — CBC WITH DIFFERENTIAL/PLATELET
Basophils Absolute: 0 10*3/uL (ref 0.0–0.2)
Basos: 0 %
EOS (ABSOLUTE): 0.1 10*3/uL (ref 0.0–0.4)
Eos: 2 %
Hematocrit: 43.9 % (ref 34.0–46.6)
Hemoglobin: 13.7 g/dL (ref 11.1–15.9)
Immature Grans (Abs): 0 10*3/uL (ref 0.0–0.1)
Immature Granulocytes: 0 %
Lymphocytes Absolute: 3.5 10*3/uL — ABNORMAL HIGH (ref 0.7–3.1)
Lymphs: 51 %
MCH: 27.6 pg (ref 26.6–33.0)
MCHC: 31.2 g/dL — ABNORMAL LOW (ref 31.5–35.7)
MCV: 89 fL (ref 79–97)
Monocytes Absolute: 0.3 10*3/uL (ref 0.1–0.9)
Monocytes: 5 %
Neutrophils Absolute: 2.8 10*3/uL (ref 1.4–7.0)
Neutrophils: 42 %
Platelets: 236 10*3/uL (ref 150–450)
RBC: 4.96 x10E6/uL (ref 3.77–5.28)
RDW: 13.8 % (ref 11.7–15.4)
WBC: 6.8 10*3/uL (ref 3.4–10.8)

## 2024-05-22 LAB — COMPREHENSIVE METABOLIC PANEL WITH GFR
ALT: 20 IU/L (ref 0–32)
AST: 21 IU/L (ref 0–40)
Albumin: 4.3 g/dL (ref 3.9–4.9)
Alkaline Phosphatase: 60 IU/L (ref 44–121)
BUN/Creatinine Ratio: 17 (ref 12–28)
BUN: 19 mg/dL (ref 8–27)
Bilirubin Total: 0.3 mg/dL (ref 0.0–1.2)
CO2: 23 mmol/L (ref 20–29)
Calcium: 9.5 mg/dL (ref 8.7–10.3)
Chloride: 107 mmol/L — ABNORMAL HIGH (ref 96–106)
Creatinine, Ser: 1.11 mg/dL — ABNORMAL HIGH (ref 0.57–1.00)
Globulin, Total: 2.8 g/dL (ref 1.5–4.5)
Glucose: 77 mg/dL (ref 70–99)
Potassium: 5.1 mmol/L (ref 3.5–5.2)
Sodium: 144 mmol/L (ref 134–144)
Total Protein: 7.1 g/dL (ref 6.0–8.5)
eGFR: 56 mL/min/{1.73_m2} — ABNORMAL LOW (ref >59)

## 2024-05-22 LAB — LIPID PANEL
Chol/HDL Ratio: 3.9 ratio (ref 0.0–4.4)
Cholesterol, Total: 205 mg/dL — ABNORMAL HIGH (ref 100–199)
HDL: 52 mg/dL (ref >39)
LDL Chol Calc (NIH): 132 mg/dL — ABNORMAL HIGH (ref 0–99)
Triglycerides: 115 mg/dL (ref 0–149)
VLDL Cholesterol Cal: 21 mg/dL (ref 5–40)

## 2024-05-26 ENCOUNTER — Ambulatory Visit: Payer: Self-pay | Admitting: Physician Assistant

## 2024-06-17 NOTE — Telephone Encounter (Signed)
 Copied from CRM [id number]. Topic: Clinical - Medical Advice >> Jun 17, 2024 12:35 PM Silvana PARAS wrote:  Reason for CRM: Pt calling to verify lab result details with provider's nurse. Callback number is [phone].

## 2024-07-02 ENCOUNTER — Ambulatory Visit (INDEPENDENT_AMBULATORY_CARE_PROVIDER_SITE_OTHER)

## 2024-07-02 VITALS — BP 136/89 | HR 53 | Temp 98.3°F | Resp 16 | Ht <= 58 in | Wt 160.2 lb

## 2024-07-02 DIAGNOSIS — Z0000 Encounter for general adult medical examination without abnormal findings: Secondary | ICD-10-CM

## 2024-07-02 DIAGNOSIS — Z1159 Encounter for screening for other viral diseases: Secondary | ICD-10-CM

## 2024-07-02 DIAGNOSIS — Z7689 Persons encountering health services in other specified circumstances: Secondary | ICD-10-CM | POA: Diagnosis not present

## 2024-07-02 DIAGNOSIS — I10 Essential (primary) hypertension: Secondary | ICD-10-CM

## 2024-07-02 DIAGNOSIS — E782 Mixed hyperlipidemia: Secondary | ICD-10-CM | POA: Insufficient documentation

## 2024-07-02 LAB — POCT GLYCOSYLATED HEMOGLOBIN (HGB A1C): Hemoglobin A1C: 5.3 % (ref 4.0–5.6)

## 2024-07-02 MED ORDER — ROSUVASTATIN CALCIUM 5 MG PO TABS: 5.0000 mg | ORAL_TABLET | Freq: Every day | ORAL | 3 refills | Status: AC

## 2024-07-02 MED ORDER — TELMISARTAN 80 MG PO TABS: 80.0000 mg | ORAL_TABLET | Freq: Every day | ORAL | 3 refills | Status: AC

## 2024-07-02 MED ORDER — AMLODIPINE BESYLATE 5 MG PO TABS: 5.0000 mg | ORAL_TABLET | Freq: Every day | ORAL | 3 refills | Status: AC

## 2024-07-02 NOTE — Progress Notes (Signed)
 Aided by  [phone]

## 2024-07-02 NOTE — Progress Notes (Signed)
 Patient ID: Cynthia Lamb, female    DOB: 1961-09-28  MRN: [id number]  CC: Medical Management of Chronic Issues   Subjective: Cynthia Lamb is a 63 y.o. female with past medical history of hypertension who presents to clinic to establish care and medication refill.  Patient would also like to complete a yearly physical today.  No acute concerns at this time. Reports she received a mammogram and Pap smear in [place] March 2024.  Per patient the results were normal.   Patient Active Problem List   Diagnosis Date Noted   Mixed hyperlipidemia 07/02/2024   Hypertension 05/21/2024   Radiculopathy, lumbar region 06/06/2022     No Known Allergies  Social History   Socioeconomic History   Marital status: Married    Spouse name: Not on file   Number of children: Not on file   Years of education: Not on file   Highest education level: Not on file  Occupational History   Not on file  Tobacco Use   Smoking status: Never   Smokeless tobacco: Never  Substance and Sexual Activity   Alcohol use: Not Currently   Drug use: Not Currently   Sexual activity: Not Currently  Other Topics Concern   Not on file  Social History Narrative   ** Merged History Encounter **       Social Drivers of Health   Financial Resource Strain: Not on file  Food Insecurity: Not on file  Transportation Needs: Not on file  Physical Activity: Not on file  Stress: Not on file  Social Connections: Not on file  Intimate Partner Violence: Not on file     ROS: Review of Systems  Constitutional:  Negative for fever.  Respiratory:  Negative for cough, chest tightness and shortness of breath.   Cardiovascular:  Negative for chest pain and leg swelling.  Gastrointestinal:  Negative for abdominal pain.  Genitourinary:  Negative for difficulty urinating.  Neurological:  Negative for weakness and headaches.   Negative except as stated above  PHYSICAL EXAM: BP 136/89   Pulse (!) 53    Temp 98.3 F (36.8 C) (Oral)   Resp 16   Ht 1' (0.305 m)   Wt 160 lb 3.2 oz (72.7 kg)   SpO2 96%   BMI 782.17 kg/m   Physical Exam  General: well-appearing, no acute distress Skin: no jaundice, rashes, or lesions Head: normocephalic, no lesions, no abnormal hair distribution or loss Eyes: anicteric sclera, pupils equally round and reactive to light and accommodation, extraocular movements intact,  Ears: no external lesions, tympanic membrane translucent  Nose: no septal deviation, turbinates clear Throat: trachea midline, no thyromegaly, moist mucus membranes, no lymphadenopathy  Cardiovascular: regular heart rate and rhythm, normal S1/S2, no murmurs, gallops, or rubs, peripheral pulses 2+ bilaterally Chest: no skeletal deformity, lungs clear to auscultation bilaterally, equal breath sounds bilaterally Abdomen: soft, non-distended, non-tender to palpation, no hepatomegaly, no splenomegaly, normoactive bowel sounds GU: Deferred Musculoskeletal: strength of upper and lower extremities equal bilaterally 5/5, normal gait Extremities: no peripheral edema, nails intact Neurologic: cranial nerves II-XII intact    ASSESSMENT AND PLAN: 1. Encounter for annual wellness visit (Primary) - Pt agreed to recommended screenings. No acute concerns at this time. - Reviewed results from previous lab results with patient (CBC, CMP) - POCT glycosylated hemoglobin (Hb A1C) is 5.3 this visit which is normal. - HIV Antibody (routine testing w rflx)  2. Primary hypertension - Discussed importance of maintaining a low sodium  diet and participating in moderate intensity at least 14min/week. - telmisartan  (MICARDIS ) 80 MG tablet; Take 1 tablet (80 mg total) by mouth daily.  Dispense: 90 tablet; Refill: 3 - amLODipine  (NORVASC ) 5 MG tablet; Take 1 tablet (5 mg total) by mouth daily.  Dispense: 90 tablet; Refill: 3  3. Encounter to establish care with new provider  4. Encounter for hepatitis C  screening test for low risk patient - Hepatitis C antibody  5. Hyperlipidemia - Discussed with patient previous lipid panel results - Start Rosuvastatin  5mg  tablet once daily    Patient was given the opportunity to ask questions.  Patient verbalized understanding of the plan and was able to repeat key elements of the plan.    Orders Placed This Encounter  Procedures   Hepatitis C antibody   HIV Antibody (routine testing w rflx)   POCT glycosylated hemoglobin (Hb A1C)     Requested Prescriptions   Signed Prescriptions Disp Refills   rosuvastatin  (CRESTOR ) 5 MG tablet 90 tablet 3    Sig: Take 1 tablet (5 mg total) by mouth daily.   telmisartan  (MICARDIS ) 80 MG tablet 90 tablet 3    Sig: Take 1 tablet (80 mg total) by mouth daily.   amLODipine  (NORVASC ) 5 MG tablet 90 tablet 3    Sig: Take 1 tablet (5 mg total) by mouth daily.    Return in about 3 months (around 10/02/2024) for Follow up.  Sula Leavy Rode, PA-C

## 2024-07-03 LAB — HEPATITIS C ANTIBODY: Hep C Virus Ab: NONREACTIVE

## 2024-07-03 LAB — HIV ANTIBODY (ROUTINE TESTING W REFLEX): HIV Screen 4th Generation wRfx: NONREACTIVE

## 2024-10-02 ENCOUNTER — Ambulatory Visit: Admitting: Family Medicine

## 2024-10-02 ENCOUNTER — Telehealth: Payer: Self-pay | Admitting: Family Medicine

## 2024-10-02 NOTE — Telephone Encounter (Signed)
 Called pt and left vm to call office back to reschedule missed appt
